# Patient Record
Sex: Male | Born: 1940 | Race: Black or African American | Hispanic: No | Marital: Married | Smoking: Current every day smoker
Health system: Southern US, Community
[De-identification: ages and names within clinical notes are randomized; demographics above are authoritative.]

## PROBLEM LIST (undated history)

## (undated) DIAGNOSIS — G629 Polyneuropathy, unspecified: Secondary | ICD-10-CM

## (undated) DIAGNOSIS — I519 Heart disease, unspecified: Secondary | ICD-10-CM

## (undated) DIAGNOSIS — F419 Anxiety disorder, unspecified: Secondary | ICD-10-CM

## (undated) DIAGNOSIS — I10 Essential (primary) hypertension: Secondary | ICD-10-CM

## (undated) DIAGNOSIS — M549 Dorsalgia, unspecified: Secondary | ICD-10-CM

## (undated) DIAGNOSIS — R12 Heartburn: Secondary | ICD-10-CM

## (undated) DIAGNOSIS — N289 Disorder of kidney and ureter, unspecified: Secondary | ICD-10-CM

## (undated) HISTORY — DX: Heart disease, unspecified: I51.9

## (undated) HISTORY — DX: Anxiety disorder, unspecified: F41.9

## (undated) HISTORY — DX: Heartburn: R12

## (undated) HISTORY — PX: HERNIA REPAIR: SHX51

## (undated) HISTORY — PX: TRANSURETHRAL RESECTION OF PROSTATE: SHX73

## (undated) HISTORY — DX: Dorsalgia, unspecified: M54.9

---

## 2002-01-08 ENCOUNTER — Emergency Department (HOSPITAL_COMMUNITY): Admission: EM | Admit: 2002-01-08 | Discharge: 2002-01-08 | Payer: Self-pay | Admitting: Emergency Medicine

## 2002-05-09 ENCOUNTER — Encounter: Payer: Self-pay | Admitting: *Deleted

## 2002-05-09 ENCOUNTER — Emergency Department (HOSPITAL_COMMUNITY): Admission: EM | Admit: 2002-05-09 | Discharge: 2002-05-10 | Payer: Self-pay | Admitting: *Deleted

## 2003-03-30 ENCOUNTER — Emergency Department (HOSPITAL_COMMUNITY): Admission: EM | Admit: 2003-03-30 | Discharge: 2003-03-30 | Payer: Self-pay | Admitting: *Deleted

## 2004-06-12 ENCOUNTER — Emergency Department (HOSPITAL_COMMUNITY): Admission: EM | Admit: 2004-06-12 | Discharge: 2004-06-12 | Payer: Self-pay | Admitting: Emergency Medicine

## 2004-08-13 ENCOUNTER — Emergency Department (HOSPITAL_COMMUNITY): Admission: EM | Admit: 2004-08-13 | Discharge: 2004-08-13 | Payer: Self-pay | Admitting: *Deleted

## 2005-04-05 ENCOUNTER — Emergency Department (HOSPITAL_COMMUNITY): Admission: EM | Admit: 2005-04-05 | Discharge: 2005-04-06 | Payer: Self-pay | Admitting: Emergency Medicine

## 2005-04-22 ENCOUNTER — Emergency Department (HOSPITAL_COMMUNITY): Admission: EM | Admit: 2005-04-22 | Discharge: 2005-04-22 | Payer: Self-pay | Admitting: Emergency Medicine

## 2005-05-03 ENCOUNTER — Emergency Department (HOSPITAL_COMMUNITY): Admission: EM | Admit: 2005-05-03 | Discharge: 2005-05-03 | Payer: Self-pay | Admitting: Emergency Medicine

## 2005-05-05 ENCOUNTER — Ambulatory Visit (HOSPITAL_COMMUNITY): Admission: RE | Admit: 2005-05-05 | Discharge: 2005-05-05 | Payer: Self-pay | Admitting: Unknown Physician Specialty

## 2006-08-05 ENCOUNTER — Emergency Department (HOSPITAL_COMMUNITY): Admission: EM | Admit: 2006-08-05 | Discharge: 2006-08-05 | Payer: Self-pay | Admitting: Emergency Medicine

## 2007-02-20 ENCOUNTER — Emergency Department (HOSPITAL_COMMUNITY): Admission: EM | Admit: 2007-02-20 | Discharge: 2007-02-20 | Payer: Self-pay | Admitting: Emergency Medicine

## 2008-01-10 ENCOUNTER — Emergency Department (HOSPITAL_COMMUNITY): Admission: EM | Admit: 2008-01-10 | Discharge: 2008-01-10 | Payer: Self-pay | Admitting: Emergency Medicine

## 2008-01-19 ENCOUNTER — Ambulatory Visit (HOSPITAL_COMMUNITY): Admission: RE | Admit: 2008-01-19 | Discharge: 2008-01-19 | Payer: Self-pay | Admitting: Orthopaedic Surgery

## 2008-07-19 ENCOUNTER — Emergency Department (HOSPITAL_COMMUNITY): Admission: EM | Admit: 2008-07-19 | Discharge: 2008-07-19 | Payer: Self-pay | Admitting: Emergency Medicine

## 2009-04-11 ENCOUNTER — Inpatient Hospital Stay (HOSPITAL_COMMUNITY): Admission: EM | Admit: 2009-04-11 | Discharge: 2009-04-12 | Payer: Self-pay | Admitting: Emergency Medicine

## 2009-04-17 DIAGNOSIS — R079 Chest pain, unspecified: Secondary | ICD-10-CM

## 2009-04-17 DIAGNOSIS — M549 Dorsalgia, unspecified: Secondary | ICD-10-CM | POA: Insufficient documentation

## 2009-04-19 ENCOUNTER — Encounter: Payer: Self-pay | Admitting: Cardiology

## 2009-04-19 ENCOUNTER — Ambulatory Visit: Payer: Self-pay | Admitting: Cardiology

## 2009-04-26 ENCOUNTER — Telehealth: Payer: Self-pay | Admitting: Cardiology

## 2010-11-11 ENCOUNTER — Encounter: Payer: Self-pay | Admitting: Cardiology

## 2010-11-25 ENCOUNTER — Emergency Department (HOSPITAL_COMMUNITY)
Admission: EM | Admit: 2010-11-25 | Discharge: 2010-11-25 | Disposition: A | Discharge status: Home / Self Care | Payer: MEDICARE | Attending: Emergency Medicine | Admitting: Emergency Medicine

## 2010-11-25 DIAGNOSIS — R339 Retention of urine, unspecified: Secondary | ICD-10-CM | POA: Insufficient documentation

## 2010-11-25 DIAGNOSIS — Z79899 Other long term (current) drug therapy: Secondary | ICD-10-CM | POA: Insufficient documentation

## 2010-11-25 DIAGNOSIS — E785 Hyperlipidemia, unspecified: Secondary | ICD-10-CM | POA: Insufficient documentation

## 2010-11-25 DIAGNOSIS — I10 Essential (primary) hypertension: Secondary | ICD-10-CM | POA: Insufficient documentation

## 2010-11-25 LAB — URINALYSIS, ROUTINE W REFLEX MICROSCOPIC
Leukocytes, UA: NEGATIVE
Nitrite: NEGATIVE
Protein, ur: NEGATIVE mg/dL
Specific Gravity, Urine: <1.005 — ABNORMAL LOW (ref 1.005–1.030)
Urobilinogen, UA: 0.2 mg/dL (ref 0.0–1.0)

## 2010-11-25 LAB — URINE MICROSCOPIC-ADD ON

## 2011-01-03 ENCOUNTER — Emergency Department (HOSPITAL_COMMUNITY)
Admission: EM | Admit: 2011-01-03 | Discharge: 2011-01-03 | Disposition: A | Discharge status: Home / Self Care | Payer: MEDICARE | Attending: Emergency Medicine | Admitting: Emergency Medicine

## 2011-01-03 DIAGNOSIS — R509 Fever, unspecified: Secondary | ICD-10-CM | POA: Insufficient documentation

## 2011-01-03 DIAGNOSIS — N390 Urinary tract infection, site not specified: Secondary | ICD-10-CM | POA: Insufficient documentation

## 2011-01-03 DIAGNOSIS — I10 Essential (primary) hypertension: Secondary | ICD-10-CM | POA: Insufficient documentation

## 2011-01-03 DIAGNOSIS — E785 Hyperlipidemia, unspecified: Secondary | ICD-10-CM | POA: Insufficient documentation

## 2011-01-03 LAB — URINALYSIS, ROUTINE W REFLEX MICROSCOPIC
Bilirubin Urine: NEGATIVE
Nitrite: POSITIVE — AB
Specific Gravity, Urine: <1.005 — ABNORMAL LOW (ref 1.005–1.030)
Urobilinogen, UA: 0.2 mg/dL (ref 0.0–1.0)
pH: 6 (ref 5.0–8.0)

## 2011-01-03 LAB — DIFFERENTIAL
Eosinophils Absolute: 0.2 10*3/uL (ref 0.0–0.7)
Lymphs Abs: 1.6 10*3/uL (ref 0.7–4.0)
Monocytes Absolute: 0.8 10*3/uL (ref 0.1–1.0)
Monocytes Relative: 8 % (ref 3–12)
Neutrophils Relative %: 75 % (ref 43–77)

## 2011-01-03 LAB — BASIC METABOLIC PANEL
BUN: 15 mg/dL (ref 6–23)
CO2: 29 mEq/L (ref 19–32)
Chloride: 102 mEq/L (ref 96–112)
Creatinine, Ser: 1.32 mg/dL (ref 0.4–1.5)

## 2011-01-03 LAB — CBC
Hemoglobin: 12.9 g/dL — ABNORMAL LOW (ref 13.0–17.0)
MCH: 30.9 pg (ref 26.0–34.0)
MCHC: 34.6 g/dL (ref 30.0–36.0)
MCV: 89.4 fL (ref 78.0–100.0)
RBC: 4.17 MIL/uL — ABNORMAL LOW (ref 4.22–5.81)

## 2011-01-03 LAB — URINE MICROSCOPIC-ADD ON

## 2011-01-06 LAB — URINE CULTURE: Colony Count: >=100000

## 2011-01-13 LAB — URINE CULTURE: Culture: NO GROWTH

## 2011-01-27 LAB — DIFFERENTIAL
Basophils Absolute: 0 10*3/uL (ref 0.0–0.1)
Eosinophils Absolute: 0.1 10*3/uL (ref 0.0–0.7)
Eosinophils Relative: 3 % (ref 0–5)
Lymphocytes Relative: 44 % (ref 12–46)
Lymphs Abs: 1.9 10*3/uL (ref 0.7–4.0)
Neutro Abs: 2.1 10*3/uL (ref 1.7–7.7)

## 2011-01-27 LAB — BASIC METABOLIC PANEL
BUN: 14 mg/dL (ref 6–23)
Calcium: 9 mg/dL (ref 8.4–10.5)
Creatinine, Ser: 1.17 mg/dL (ref 0.4–1.5)
GFR calc non Af Amer: >60 mL/min (ref >60)
Glucose, Bld: 97 mg/dL (ref 70–99)

## 2011-01-27 LAB — CBC
Platelets: 114 10*3/uL — ABNORMAL LOW (ref 150–400)
RBC: 4.25 MIL/uL (ref 4.22–5.81)
RDW: 13.1 % (ref 11.5–15.5)
WBC: 4 10*3/uL (ref 4.0–10.5)

## 2011-01-27 LAB — COMPREHENSIVE METABOLIC PANEL
ALT: 19 U/L (ref 0–53)
AST: 25 U/L (ref 0–37)
Alkaline Phosphatase: 52 U/L (ref 39–117)
CO2: 30 mEq/L (ref 19–32)
Calcium: 9.1 mg/dL (ref 8.4–10.5)
Chloride: 104 mEq/L (ref 96–112)
GFR calc Af Amer: >60 mL/min (ref >60)
GFR calc non Af Amer: >60 mL/min (ref >60)
Potassium: 3.9 mEq/L (ref 3.5–5.1)
Sodium: 139 mEq/L (ref 135–145)
Total Bilirubin: 0.4 mg/dL (ref 0.3–1.2)

## 2011-01-27 LAB — POCT CARDIAC MARKERS: Troponin i, poc: <0.05 ng/mL (ref 0.00–0.09)

## 2011-01-27 LAB — LIPID PANEL
Cholesterol: 161 mg/dL (ref 0–200)
Total CHOL/HDL Ratio: 5.2 RATIO
VLDL: 43 mg/dL — ABNORMAL HIGH (ref 0–40)

## 2011-01-27 LAB — CARDIAC PANEL(CRET KIN+CKTOT+MB+TROPI)
CK, MB: 2.1 ng/mL (ref 0.3–4.0)
CK, MB: 2.4 ng/mL (ref 0.3–4.0)
CK, MB: 2.8 ng/mL (ref 0.3–4.0)
Relative Index: 1 (ref 0.0–2.5)
Total CK: 235 U/L — ABNORMAL HIGH (ref 7–232)
Total CK: 263 U/L — ABNORMAL HIGH (ref 7–232)

## 2011-01-27 LAB — D-DIMER, QUANTITATIVE: D-Dimer, Quant: 0.35 ug/mL-FEU (ref 0.00–0.48)

## 2011-03-02 ENCOUNTER — Emergency Department (HOSPITAL_COMMUNITY)
Admission: EM | Admit: 2011-03-02 | Discharge: 2011-03-02 | Disposition: A | Discharge status: Home / Self Care | Payer: MEDICARE | Attending: Emergency Medicine | Admitting: Emergency Medicine

## 2011-03-02 DIAGNOSIS — S90569A Insect bite (nonvenomous), unspecified ankle, initial encounter: Secondary | ICD-10-CM | POA: Insufficient documentation

## 2011-03-02 DIAGNOSIS — E785 Hyperlipidemia, unspecified: Secondary | ICD-10-CM | POA: Insufficient documentation

## 2011-03-02 DIAGNOSIS — W57XXXA Bitten or stung by nonvenomous insect and other nonvenomous arthropods, initial encounter: Secondary | ICD-10-CM | POA: Insufficient documentation

## 2011-03-02 DIAGNOSIS — F17200 Nicotine dependence, unspecified, uncomplicated: Secondary | ICD-10-CM | POA: Insufficient documentation

## 2011-03-02 DIAGNOSIS — I10 Essential (primary) hypertension: Secondary | ICD-10-CM | POA: Insufficient documentation

## 2011-03-04 NOTE — Assessment & Plan Note (Signed)
[facility]                       [place] [facility] OFFICE NOTE   NAME:VOSSSkye, Rodarte                       MRN:          [id number]  DATE:04/19/2009                            DOB:          12/19/1941    I was asked by Dr. Derenda Mis to consult on Dennis Ruiz for chest  discomfort and cardiac risk factors.   Dennis Ruiz is a 70 year old African American Ruiz, who was admitted on  April 11, 2009 to [facility] with chest discomfort.  Described  as ache in his left shoulder coming around to his chest.  He could  belch, but could not quite get rid of it.   He has had a history of chest discomfort in the past.  Apparently, he  had a stress test years ago in [place].   His discomfort was relieved with a GI cocktail and a proton pump  inhibitor, which he was discharged on.  His enzymes were negative.   EKG showed no acute changes.  Chest x-ray demonstrated minimal scarring  of the lung bases, right greater than left, and calcified pleural  plaques on the left.  He has a history of granulomatous disease.  A  chest CT was also obtained, which showed granulomatous disease.   He does smoke about 2 cigarettes a day.  He used to be a heavy smoker.   Apparently, he has had a history of hypertension in the past, but his  medicines were stopped in [place] because his pressure got too low.  In  addition, he has a history of hyperlipidemia and has been on Lipitor in  the past, but he is no longer taking.  He does take an aspirin 325 mg a  day.   PAST MEDICAL HISTORY:  In addition to above, he has chronic neck issues  and is disabled from an injury at work in [employer].   He smokes 2 cigarettes a day.  He quit drinking about a year ago.   SURGICAL HISTORY:  Hernia repaired about 20 years ago in [place].   SOCIAL HISTORY:  Disabled.  He is widowed.  He has 3 children, 2 boys  and a girl.   FAMILY HISTORY:  Really unremarkable for  any premature coronary artery  disease.   REVIEW OF SYSTEMS:  He wears reading glasses.  His teeth are in good  shape with no dentures.  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  Otherwise, his review of systems are negative.   CURRENT MEDICATIONS:  1. Aspirin 325 mg per day.  2. Omeprazole 20 mg a day.  3. Restoril 30 mg nightly.  4. Fish oil 1000 mg daily.  5. Multivitamin daily.  6. Cialis 5 mg p.r.n.  7. He takes Vicodin 5/500 p.r.n.  8. OxyContin 10 mg a day and p.r.n.   PHYSICAL EXAMINATION:  VITAL SIGNS:  He is 6 feet 2 and weighs 221.  GENERAL:  He is a very pleasant gentleman in no acute distress.  HEENT:  Bearded, graying, teeth in good repair, otherwise negative.  NECK:  Supple.  Carotid upstrokes were equal bilaterally without obvious  bruit.  Thyroid is not enlarged.  No lymphadenopathy.  LUNGS:  Inspiratory and expiratory rhonchi.  No rubs.  HEART:  A nondisplaced PMI, normal S1 and S2.  No murmur, rub, or  gallop.  ABDOMEN:  Soft, good bowel sounds.  No midline bruit.  No hepatomegaly.  EXTREMITIES:  No cyanosis, clubbing, or edema.  Pulses are present.  There is no sign of DVT.  SKIN:  Unremarkable.  MUSCULOSKELETAL:  Chronic arthritic changes.   His electrocardiogram shows sinus rhythm with no ST-segment changes.   ASSESSMENT:  1. Chest discomfort in a man with multiple cardiac risk factors.  2. Hyperlipidemia.  No longer being treated.  3. Hypertension, resolved without meds.  4. Tobacco use.   PLAN:  1. Adenosine rest stress Myoview or Lexiscan.  2. Continue aspirin.  3. Fasting lipids and a CMET.  He will probably need a statin drug      such as Lipitor that he has taken in the past.   The patient is counseled not to smoke.     Thomas C. Daleen Squibb, MD, [facility]  Electronically Signed    TCW/MedQ  DD: 04/19/2009  DT: 04/20/2009  Job #: [id number]   cc:   Melissa L. Ladona Ridgel, MD

## 2011-03-04 NOTE — H&P (Signed)
Dennis Ruiz, Dennis Ruiz                ACCOUNT NO.:  [id number]   MEDICAL RECORD NO.:  [id number]          PATIENT TYPE:  INP   LOCATION:  A328                          FACILITY:  [facility]   PHYSICIAN:  Osvaldo Shipper, MD     DATE OF BIRTH:  12/19/1941   DATE OF ADMISSION:  04/11/2009  DATE OF DISCHARGE:  LH                              HISTORY & PHYSICAL   The patient does not have a PMD.  He has been seen by Dr. Hilda Lias in the  past for back, neck pain.   ADMISSION DIAGNOSES:  1. Left-sided chest pain.  2. History of chronic back pain and neck pain.   CHIEF COMPLAINT:  Left-sided chest pain for the past 3 days.   HISTORY OF PRESENT ILLNESS:  The patient is a 70 year old African  American male who has back pain and neck pain, however has not seen a  primary medical doctor in many years.  He was doing well until Saturday  morning when he drove to the mountains, which was about a five hour  drive and while he was up there he started feeling tightness in the left  side of his chest.  These symptoms progressively got worse over the  following 2-3 days.  He drove back to [place].  He did not seek any  attention for his chest pain until today.  The pain is described as a  tightness.  It was 10/10 in intensity.  Currently he said it is 8/10 in  intensity, though he is sitting comfortably.  He states that the pain  fell like a gas.  He also admits to some shortness of breath 2 days ago  but not currently.  No nausea, vomiting, no palpitations.  No  diaphoresis, though he did feel hot.  Denies any lightheadedness.  He  denies any leg swelling.  He states he had similar pain 6 month ago but  did not see a doctor.  It feels like heartburn.  He feels like he wants  to burp but he cannot.  There is no radiation of the pain.  No  aggravating, relieving or precipitating factors identified.  Denies any  weight loss.   MEDICATIONS AT HOME:  1. Restoril for insomnia which he has not been taking for  2-3 days.  2. Vicodin for back pain, has not been taking for past few days.  3. OxyContin 10 mg once a day, not been taking for 3-4 days.  4. He took Cialis for erectile dysfunction about a week to 2 weeks ago      and none since them.  5. He also takes vitamin E, fish oil and vitamin C.  6. No aspirin use.   ALLERGIES:  No known drug allergies.   PAST MEDICAL HISTORY:  Denies any heart disease, lung disease, stroke.  He has back pain, neck pain, insomnia.  Otherwise he denies any other  complaints.  No other medical problems.  He had a stress test 10 years  ago which was thought to be normal.  He has never had a cardiac cath.  He has  had a hernia repair in the past.   SOCIAL HISTORY:  He drives a lot.  His wife passed earlier this year.  He currently is on disability.  He smokes two cigarettes per day.  No  alcohol use.  Denies any illicit drug use.   FAMILY HISTORY:  Father died of unknown cancer, some heart disease in  his father at the age of 50 and also some unknown cancer in siblings,  but he does not seem to know much about them.   REVIEW OF SYSTEMS:  GENERAL:  Positive for weakness, malaise.  HEENT:  Unremarkable.  CARDIOVASCULAR:  As in  HPI.  RESPIRATORY:  Positive for  cough with whitish expectoration.  Denies any hemoptysis.  Denies any  fever or chills.  GI: Unremarkable.  GU:  Unremarkable.  NEUROLOGIC:  Unremarkable.  PSYCHIATRIC:  Unremarkable.  MUSCULOSKELETAL:  Positive  for back pain, otherwise unremarkable.  Other systems are all negative.   PHYSICAL EXAMINATION:  When he presented to the ED his temperature was  98.0, blood pressure was 134/73, heart rate 65, respiratory rate 20,  saturation 99% on room air.  Currently heart rate in the 50s.  GENERAL EXAM:  Well-developed, well-nourished black male in no distress.  HEENT: There is no pallor, no icterus.  Head is normocephalic,  atraumatic.  Moist mucous membranes are present.  NECK:  Soft and supple.  No  thyromegaly is appreciated.  No  lymphadenopathy is noted in the cervical and supraclavicular regions.  LUNGS:  Clear to auscultation bilaterally.  No wheezing, rales or  rhonchi.  No dullness to percussion.  CARDIOVASCULAR:  S1. S2 are normal, regular.  No murmurs appreciated.  No S3, S4, no rubs.  No bruits.  No JVD.  No pedal edema.  Peripheral  pulses are palpable.  ABDOMEN:  Soft, nontender, nondistended.  Bowel sounds are present.  No  mass or organomegaly is appreciated.  Chest does not reveal any  tenderness to palpation.  GU:  Examination deferred.  MUSCULOSKELETAL:  Unremarkable.  NEUROLOGIC:  He is alert, oriented x3.  No focal neurological deficits  are present.  SKIN:  Shows no rashes.   CBC shows platelet count 125, slightly on the lower side.  He also has  slightly low neutrophil count but the total white count is normal.  Actually otherwise other differential is okay.  D-dimer was normal.  Electrolytes and liver function tests are unremarkable.  Cardiac enzymes  negative x1.  This was at 12:30 p.m. today.  Chest x-ray:  Read as  showing normal heart size.  Several calcified granulomas in the left  lung which have been previously present, may be an element of COPD  identified.   EKG shows a sinus rhythm with a normal axis.  Intervals appear to be in  the normal range.  There might be a Q-wave in V1 and V2, T inversion V1,  V2, otherwise no other changes appreciated.  No other EKGs are available  at this time.   ASSESSMENT:  This is a 70 year old African American male who presents  with 3-4 day history of chest pain, left-sided, described as a  tightness.  With a negative D-dimer thromboembolic event is less likely.  The differentials include coronary artery disease.  Gastrointestinal  etiology is also possible.  I do mot know if the nonspecific granulomas  seen on chest x-ray are playing a role as they happen to be on the same  side as his chest pain.  He is a  smoker.   PLAN:  1. Chest pain.  He is currently on telemetry.  He will be ruled out      for acute coronary syndrome with serial cardiac enzymes.      Electrocardiograms will be repeated.  Lipid panel will be checked.      I think we need to further evaluate these granulomas seen on chest      x-ray.  He has never had a CT so I am going to go ahead and order      that. Because he is mentioning belching-like symptoms, I will give      him gastrointestinal cocktail and Protonix.  We can also try a dose      of nitroglycerin to see if that changes his chest pain. His Cialis      use was more than a week ago, so it should not interfere with      nitroglycerin use at this time.  2. Mild thrombocytopenia.  We will closely monitor this.  His liver      function tests are normal.  I will go ahead and order an HIV test.  3. Deep vein thrombosis prophylaxis for now with Lovenox and if there      is evidence that his platelet counts are dropping this will need to      be discontinued and alternative methods will have to be utilized.   Further management decisions will depend on results of further testing  and patient's response to treatment.      Osvaldo Shipper, MD  Electronically Signed     GK/MEDQ  D:  04/11/2009  T:  04/11/2009  Job:  [id number]

## 2011-03-04 NOTE — Discharge Summary (Signed)
NAME:  Dennis Ruiz, Dennis Ruiz                ACCOUNT NO.:  [id number]   MEDICAL RECORD NO.:  [id number]          PATIENT TYPE:  INP   LOCATION:  A328                          FACILITY:  [facility]   PHYSICIAN:  Melissa L. Ladona Ridgel, MD  DATE OF BIRTH:  12/19/1941   DATE OF ADMISSION:  04/11/2009  DATE OF DISCHARGE:  06/24/2010LH                               DISCHARGE SUMMARY   DISCHARGING DIAGNOSES:  1. Atypical chest pain which does not appear to be cardiac, at least      based on cardiac markers and EKG findings.  The patient was ruled      out with serial cardiac markers and actually responded the most      favorably to a GI cocktail and proton pump inhibitor with the      complete resolution of his pain.  At this time the patient and I      are in agreement that outpatient testing for his heart would be in      order just to make sure that it is not cardiac in origin.  He has      been instructed that since he travels a bit that if he develops      further chest pain, then he should report to the nearest emergency      room, but at this time I feel comfortable that outpatient stress      testing would be appropriate.  I will discharge the patient on a      proton pump inhibitor and an aspirin and have him follow up with      [facility] next week.  They have been kind enough to give      [place] an appointment July 1 at 1 p.m. with Dr. Daleen Squibb.  2. Granulomatous disease on a chest x-ray.  a CT of the chest was      obtained, which showed no obvious pneumonia, blood clots or any      extensive granulomatous disease that needs to be investigated.  the      reported finding shows a linear scarring or atelectasis at the      right lower lobe and there are scattered calcified pleural plaques      on the left, which likely correspond to the findings on the x-ray.      the x-ray itself reported the calcified granuloma in the left mid      lung.  3. Tobacco abuse.  The patient has been counseled to  discontinue      smoking as it is unhealthy for his lungs and his heart.  4. Chronic neck pain.  The patient sees a physician in [place], who      is his primary physician for his pain medications.  He is scheduled      to see him sometime soon, and I will not be giving him any      narcotics for discharge.   MEDICATIONS AT THE TIME OF DISCHARGE:  1. Aspirin 325 mg.  2. Omeprazole 20 mg a day.  3. The patient can continue the Restoril,  which he believes is 30 mg      at bedtime.  4. He can continue his home Vicodin dose as needed, and this can be      obtained from his physician in [place].  5. He can continue his OxyContin 10 mg once daily.  Again, this will      be provided by his physician in [place].  6. The patient can continue his fish oil and vitamins.   He has been educated on the use of Cialis in conjunction with nitrates.   HOSPITAL COURSE:  The patient is a very pleasant 70 year old [place]  American gentleman, who states that he has traveled out to [place], [place], to visit with his son.  He took kind of a circuitous route  through the mountains up into [place] and did a lot of driving.  Based  on the patient's serious nature of his neck and back, this definitely  was exacerbating some of his symptoms and may actually account for the  pain that he was having in his chest.  The patient described no  associated symptoms of shortness of breath, diaphoresis,  lightheadedness, nausea, vomiting or sweating, and therefore I feel that  this pain is generally atypical for a true angina.  On the day of  discharge, the patient is clinically well.  He is chest pain-free.   Temperature is 97.3, blood pressures have ranged from 95-122 systolic  and diastolic 62-72, heart rate 51, respirations 20, saturation 100%.  GENERAL:  This is a moderately obese African American male in no acute  distress.  He is normocephalic, atraumatic.  Pupils equal, round and reactive to   light.  Extraocular muscles intact.  Mucous membranes are moist.  NECK:  Supple.  There is no JVD, no lymph nodes, no carotid bruits.  CHEST:  Decreased but clear to auscultation.  There are no rhonchi,  rales or wheezes.  CARDIOVASCULAR:  Regular rate rhythm, positive S1, S2.  No S3, S4.  No  murmurs, rubs or gallops.  ABDOMEN:  Soft, nontender, nondistended, with positive bowel sounds.  EXTREMITIES:  Show no clubbing, cyanosis or edema.  NEUROLOGIC:  He is awake, alert, oriented.   PERTINENT LABORATORY VALUES DURING THE COURSE OF THIS HOSPITAL STAY:  Point of care markers were negative.  Three sets of cardiac markers  showed only a slight elevation in his creatine  kinase but no elevation  in his MB or troponin.  His white count was 4.9 with a hemoglobin of 14,  hematocrit 39.8 and platelets of 114.  His BMET:  Sodium is 140,  potassium 4.4, chloride 103, CO2 31, glucose was 97, BUN 14, and his  creatinine is 1.7.   An HIV test was scheduled to be sent; however, they have not received  the specimen yet and therefore I will need to follow up on this for the  patient while he discharged from the hospital, so I will do so and  contact the patient if there is any abnormality there.   So at this time the patient is deemed stable for discharge to home with  followup as an outpatient to [facility] for a stress test, which  has been scheduled for July 1 at 1 p.m.   CONDITION AT DISCHARGE:  Stable.  The patient has been instructed if the  chest pain returns that he should go to the nearest emergency room for  further evaluation.  The patient also has been instructed that perhaps  driving long distances without a break is not an appropriate way for him  to travel with the extensive nature of his neck pain and debility.  He  is unable to really turn his neck extensively from one side to another  because of his previous neck problems, and therefore, I am recommending  that if he does  drive that he should take frequent breaks and rest his  shoulders and his neck.   This is less than a 30-minutes discharge.      Melissa L. Ladona Ridgel, MD  Electronically Signed     MLT/MEDQ  D:  04/12/2009  T:  04/12/2009  Job:  [id number]

## 2011-07-21 LAB — DIFFERENTIAL
Eosinophils Relative: 4
Lymphocytes Relative: 44
Lymphs Abs: 1.8
Monocytes Absolute: 0.6
Monocytes Relative: 15 — ABNORMAL HIGH
Neutro Abs: 1.5 — ABNORMAL LOW

## 2011-07-21 LAB — CBC
HCT: 40.2
Hemoglobin: 13.9
RBC: 4.24

## 2011-07-21 LAB — POCT CARDIAC MARKERS
CKMB, poc: 1.6
Myoglobin, poc: 81.7
Troponin i, poc: <0.05
Troponin i, poc: <0.05

## 2011-07-21 LAB — BASIC METABOLIC PANEL
Calcium: 9.2
GFR calc Af Amer: >60
GFR calc non Af Amer: 53 — ABNORMAL LOW
Glucose, Bld: 103 — ABNORMAL HIGH
Potassium: 3.8
Sodium: 140

## 2011-10-20 ENCOUNTER — Encounter: Payer: Self-pay | Admitting: Cardiology

## 2011-11-26 ENCOUNTER — Encounter (HOSPITAL_COMMUNITY): Payer: Self-pay | Admitting: *Deleted

## 2011-11-26 ENCOUNTER — Emergency Department (HOSPITAL_COMMUNITY)
Admission: EM | Admit: 2011-11-26 | Discharge: 2011-11-26 | Disposition: A | Discharge status: Home / Self Care | Payer: Medicare Other | Attending: Emergency Medicine | Admitting: Emergency Medicine

## 2011-11-26 ENCOUNTER — Emergency Department (HOSPITAL_COMMUNITY): Payer: Medicare Other

## 2011-11-26 DIAGNOSIS — R079 Chest pain, unspecified: Secondary | ICD-10-CM | POA: Insufficient documentation

## 2011-11-26 DIAGNOSIS — S20219A Contusion of unspecified front wall of thorax, initial encounter: Secondary | ICD-10-CM | POA: Insufficient documentation

## 2011-11-26 DIAGNOSIS — Z79899 Other long term (current) drug therapy: Secondary | ICD-10-CM | POA: Insufficient documentation

## 2011-11-26 DIAGNOSIS — X500XXA Overexertion from strenuous movement or load, initial encounter: Secondary | ICD-10-CM | POA: Insufficient documentation

## 2011-11-26 DIAGNOSIS — Z7982 Long term (current) use of aspirin: Secondary | ICD-10-CM | POA: Insufficient documentation

## 2011-11-26 MED ORDER — HYDROCODONE-ACETAMINOPHEN 5-325 MG PO TABS
1.0000 | ORAL_TABLET | Freq: Once | ORAL | Status: AC
  Administered 2011-11-26: 1 via ORAL
  Filled 2011-11-26: qty 1

## 2011-11-26 MED ORDER — HYDROCODONE-ACETAMINOPHEN 5-325 MG PO TABS: 1.0000 | ORAL_TABLET | Freq: Four times a day (QID) | ORAL | Status: AC | PRN

## 2011-11-26 NOTE — ED Provider Notes (Addendum)
History     CSN: [id number]  Arrival date & time 11/26/11  5784   First MD Initiated Contact with Patient 11/26/11 2125      Chief Complaint  Patient presents with  . 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  He is c/o R lower anterior rib pain  The history is provided by the patient. No language interpreter was used.    Past Medical History  Diagnosis Date  . Back pain   . Chest pain     Past Surgical History  Procedure Date  . Hernia repair     Family History  Problem Relation Age of Onset  . Cancer Father     History  Substance Use Topics  . Smoking status: Smoker, Current Status Unknown  . Smokeless tobacco: Not on file  . Alcohol Use: No      Review of Systems  Respiratory: Negative for cough, shortness of breath, wheezing and stridor.        Rib pain  All other systems reviewed and are negative.    Allergies  Review of patient's allergies indicates no known allergies.  Home Medications   Current Outpatient Rx  Name Route Sig Dispense Refill  . ASPIRIN EC 81 MG PO TBEC Oral Take 81 mg by mouth every morning.    Marland Kitchen OMEGA-3 FATTY ACIDS 1000 MG PO CAPS Oral Take 1 capsule by mouth daily.      Marland Kitchen HYDROCODONE-ACETAMINOPHEN 5-500 MG PO TABS Oral Take 0.5-1 tablets by mouth daily as needed. For pain    . ADULT MULTIVITAMIN W/MINERALS CH Oral Take 1 tablet by mouth every morning.    Marland Kitchen TADALAFIL 5 MG PO TABS Oral Take 5 mg by mouth daily as needed. For intercourse      BP 133/78  Pulse 85  Temp(Src) 98.5 F (36.9 C) (Oral)  Resp 18  Ht 6\' 2"  (1.88 m)  Wt 200 lb (90.719 kg)  BMI 25.68 kg/m2  SpO2 99%  Physical Exam  Nursing note and vitals reviewed. Constitutional: He is oriented to person, place, and time. He appears well-developed and well-nourished.  HENT:  Head: Normocephalic and atraumatic.  Eyes: EOM are  normal.  Neck: Normal range of motion.  Cardiovascular: Normal rate, regular rhythm, normal heart sounds and intact distal pulses.   Pulmonary/Chest: Effort normal and breath sounds normal. No accessory muscle usage. Not tachypneic. No respiratory distress. He has no decreased breath sounds. He has no wheezes. He has no rhonchi. He has no rales. He exhibits tenderness.  Abdominal: Soft. He exhibits no distension. There is no tenderness.  Musculoskeletal: Normal range of motion.  Neurological: He is alert and oriented to person, place, and time.  Skin: Skin is warm and dry.  Psychiatric: He has a normal mood and affect. Judgment normal.    ED Course  Procedures (including critical care time)  Labs Reviewed - No data to display Dg Ribs Unilateral W/chest Right  11/26/2011  *RADIOLOGY REPORT*  Clinical Data: Right rib pain.  RIGHT RIBS AND CHEST - 3+ VIEW  Comparison: Chest x-ray dated 04/11/2009  Findings: No evidence of right rib fracture, pleural fluid or pneumothorax.  Visualized lung fields are clear.  The heart size is within normal limits.  No bony lesions.  IMPRESSION: Normal right ribs.  Original Report Authenticated By: Reola Calkins, M.D.     No diagnosis found.    MDM  Worthy Rancher, PA 11/26/11 2135  Worthy Rancher, PA 11/28/11 [phone]

## 2011-11-26 NOTE — ED Notes (Signed)
Pt states was working on truck, "wresling a part as I was leaning against the door rather hard and felt something pop". Pt denies SOB or any difficulty breathing. Pain increases with movement of arm.

## 2011-11-26 NOTE — ED Notes (Signed)
Pt reports he was working on his truck yesterday and reached over and heard a "pop" on rt side, now c/o pain to rt rib area, nad

## 2011-11-26 NOTE — ED Notes (Signed)
Pt requesting copy of xray's to take to PCP's office visit. Radiology notified of request.

## 2011-11-27 NOTE — ED Provider Notes (Signed)
Medical screening examination/treatment/procedure(s) were performed by non-physician practitioner and as supervising physician I was immediately available for consultation/collaboration.  Marinus Eicher S. Jerris Keltz, MD 11/27/11 1513 

## 2011-11-28 NOTE — ED Provider Notes (Signed)
Medical screening examination/treatment/procedure(s) were performed by non-physician practitioner and as supervising physician I was immediately available for consultation/collaboration. Devoria Albe, MD, FACEP   Ward Givens, MD 11/28/11 [phone]

## 2012-01-08 ENCOUNTER — Encounter (HOSPITAL_COMMUNITY): Payer: Self-pay | Admitting: *Deleted

## 2012-01-08 ENCOUNTER — Emergency Department (HOSPITAL_COMMUNITY)
Admission: EM | Admit: 2012-01-08 | Discharge: 2012-01-08 | Disposition: A | Discharge status: Home / Self Care | Payer: Medicare Other | Attending: Emergency Medicine | Admitting: Emergency Medicine

## 2012-01-08 ENCOUNTER — Emergency Department (HOSPITAL_COMMUNITY): Payer: Medicare Other

## 2012-01-08 DIAGNOSIS — F17200 Nicotine dependence, unspecified, uncomplicated: Secondary | ICD-10-CM | POA: Insufficient documentation

## 2012-01-08 DIAGNOSIS — Z79899 Other long term (current) drug therapy: Secondary | ICD-10-CM | POA: Insufficient documentation

## 2012-01-08 DIAGNOSIS — R0989 Other specified symptoms and signs involving the circulatory and respiratory systems: Secondary | ICD-10-CM | POA: Insufficient documentation

## 2012-01-08 DIAGNOSIS — Z7982 Long term (current) use of aspirin: Secondary | ICD-10-CM | POA: Insufficient documentation

## 2012-01-08 DIAGNOSIS — R05 Cough: Secondary | ICD-10-CM

## 2012-01-08 DIAGNOSIS — J40 Bronchitis, not specified as acute or chronic: Secondary | ICD-10-CM | POA: Insufficient documentation

## 2012-01-08 DIAGNOSIS — R059 Cough, unspecified: Secondary | ICD-10-CM | POA: Insufficient documentation

## 2012-01-08 MED ORDER — GUAIFENESIN-CODEINE 100-10 MG/5ML PO SYRP: 5.0000 mL | ORAL_SOLUTION | Freq: Three times a day (TID) | ORAL | Status: AC | PRN

## 2012-01-08 MED ORDER — PREDNISONE 20 MG PO TABS: 40.0000 mg | ORAL_TABLET | Freq: Every day | ORAL | Status: AC

## 2012-01-08 MED ORDER — GUAIFENESIN-CODEINE 100-10 MG/5ML PO SOLN
5.0000 mL | Freq: Once | ORAL | Status: AC
  Administered 2012-01-08: 5 mL via ORAL
  Filled 2012-01-08: qty 5

## 2012-01-08 NOTE — Discharge Instructions (Signed)
Antibiotic Nonuse  Your caregiver felt that the infection or problem was not one that would be helped with an antibiotic. Infections may be caused by viruses or bacteria. Only a caregiver can tell which one of these is the likely cause of an illness. A cold is the most common cause of infection in both adults and children. A cold is a virus. Antibiotic treatment will have no effect on a viral infection. Viruses can lead to many lost days of work caring for sick children and many missed days of school. Children may catch as many as 10 "colds" or "flus" per year during which they can be tearful, cranky, and uncomfortable. The goal of treating a virus is aimed at keeping the ill person comfortable. Antibiotics are medications used to help the body fight bacterial infections. There are relatively few types of bacteria that cause infections but there are hundreds of viruses. While both viruses and bacteria cause infection they are very different types of germs. A viral infection will typically go away by itself within 7 to 10 days. Bacterial infections may spread or get worse without antibiotic treatment. Examples of bacterial infections are:  Sore throats (like strep throat or tonsillitis).   Infection in the lung (pneumonia).   Ear and skin infections.  Examples of viral infections are:  Colds or flus.   Most coughs and bronchitis.   Sore throats not caused by Strep.   Runny noses.  It is often best not to take an antibiotic when a viral infection is the cause of the problem. Antibiotics can kill off the helpful bacteria that we have inside our body and allow harmful bacteria to start growing. Antibiotics can cause side effects such as allergies, nausea, and diarrhea without helping to improve the symptoms of the viral infection. Additionally, repeated uses of antibiotics can cause bacteria inside of our body to become resistant. That resistance can be passed onto harmful bacterial. The next time  you have an infection it may be harder to treat if antibiotics are used when they are not needed. Not treating with antibiotics allows our own immune system to develop and take care of infections more efficiently. Also, antibiotics will work better for us when they are prescribed for bacterial infections. Treatments for a child that is ill may include:  Give extra fluids throughout the day to stay hydrated.   Get plenty of rest.   Only give your child over-the-counter or prescription medicines for pain, discomfort, or fever as directed by your caregiver.   The use of a cool mist humidifier may help stuffy noses.   Cold medications if suggested by your caregiver.  Your caregiver may decide to start you on an antibiotic if:  The problem you were seen for today continues for a longer length of time than expected.   You develop a secondary bacterial infection.  SEEK MEDICAL CARE IF:  Fever lasts longer than 5 days.   Symptoms continue to get worse after 5 to 7 days or become severe.   Difficulty in breathing develops.   Signs of dehydration develop (poor drinking, rare urinating, dark colored urine).   Changes in behavior or worsening tiredness (listlessness or lethargy).  Document Released: 12/15/2001 Document Revised: 09/25/2011 Document Reviewed: 06/13/2009 [employer] Patient Information 2012 [employer].Bronchitis Bronchitis is a problem of the air tubes leading to your lungs. This problem makes it hard for air to get in and out of the lungs. You may cough a lot because your air tubes are   narrow. Going without care can cause lasting (chronic) bronchitis. HOME CARE   Drink enough fluids to keep your pee (urine) clear or pale yellow.   Use a cool mist humidifier.   Quit smoking if you smoke. If you keep smoking, the bronchitis might not get better.   Only take medicine as told by your doctor.  GET HELP RIGHT AWAY IF:   Coughing keeps you awake.   You start to wheeze.    You become more sick or weak.   You have a hard time breathing or get short of breath.   You cough up blood.   Coughing lasts more than 2 weeks.   You have a fever.   Your baby is older than 3 months with a rectal temperature of 102 F (38.9 C) or higher.   Your baby is 81 months old or younger with a rectal temperature of 100.4 F (38 C) or higher.  MAKE SURE YOU:  Understand these instructions.   Will watch your condition.   Will get help right away if you are not doing well or get worse.  Document Released: 03/24/2008 Document Revised: 09/25/2011 Document Reviewed: 09/07/2009 [facility] Patient Information 2012 [place], [place].Cough, Adult  A cough is a reflex that helps clear your throat and airways. It can help heal the body or may be a reaction to an irritated airway. A cough may only last 2 or 3 weeks (acute) or may last more than 8 weeks (chronic).  CAUSES Acute cough:  Viral or bacterial infections.  Chronic cough:  Infections.   Allergies.   Asthma.   Post-nasal drip.   Smoking.   Heartburn or acid reflux.   Some medicines.   Chronic lung problems (COPD).   Cancer.  SYMPTOMS   Cough.   Fever.   Chest pain.   Increased breathing rate.   High-pitched whistling sound when breathing (wheezing).   Colored mucus that you cough up (sputum).  TREATMENT   A bacterial cough may be treated with antibiotic medicine.   A viral cough must run its course and will not respond to antibiotics.   Your caregiver may recommend other treatments if you have a chronic cough.  HOME CARE INSTRUCTIONS   Only take over-the-counter or prescription medicines for pain, discomfort, or fever as directed by your caregiver. Use cough suppressants only as directed by your caregiver.   Use a cold steam vaporizer or humidifier in your bedroom or home to help loosen secretions.   Sleep in a semi-upright position if your cough is worse at night.   Rest as needed.    Stop smoking if you smoke.  SEEK IMMEDIATE MEDICAL CARE IF:   You have pus in your sputum.   Your cough starts to worsen.   You cannot control your cough with suppressants and are losing sleep.   You begin coughing up blood.   You have difficulty breathing.   You develop pain which is getting worse or is uncontrolled with medicine.   You have a fever.  MAKE SURE YOU:   Understand these instructions.   Will watch your condition.   Will get help right away if you are not doing well or get worse.  Document Released: 04/04/2011 Document Revised: 09/25/2011 Document Reviewed: 04/04/2011 [facility] Patient Information 2012 [place], [place].  RESOURCE GUIDE  Dental Problems  Patients with Medicaid: [facility]  [address]                                           [phone] Phone:  [phone]                                                  Phone:  [phone]  If unable to pay or uninsured, contact:  [facility] or [facility]. to become qualified for the adult dental clinic.  Chronic Pain Problems Contact [facility]  [phone] Patients need to be referred by their primary care doctor.  Insufficient Money for Medicine Contact [employer]:  call "211" or [facility] [phone].  No Primary Care Doctor Call [facility]  [phone] Other agencies that provide inexpensive medical care    [facility]  [phone]    [facility]  [phone]    [facility]  [phone]    [facility]  [phone]    [facility]  [phone]    [facility]  [phone]  Psychological [facility]  [phone] [facility]  [phone] [facility]   6121098281 (emergency services [phone])  Substance Abuse Resources [facility]  6155860914 [facility]  [phone] [address] [phone] Floydene Flock [phone] Residential & Outpatient Substance Abuse Program  6124739807  Abuse/Neglect [facility] Child Abuse Hotline [phone] [phone] [facility] Child Abuse Hotline 5143413929 (After Hours)  Emergency Shelter [facility] [phone]  [facility] [place] [facility] [place] [phone] [facility] [phone]  MRSA Hotline #:   (980) 581-3495    [facility] Resources  [facility] [place]     [employer]                          [facility] Dept. [address] [place]                       [address]      [address]  Blondell Reveal Phone:  [phone]                                   Phone:  [phone]                 Phone:  [phone]  [facility] Phone:  [phone]  [facility] Child Abuse Hotline [phone] (After Hours)

## 2012-01-08 NOTE — ED Provider Notes (Signed)
History    71 year old male with cough. Gradual onset about a week and a half ago. Feels congested. Is productive. No shortness of breath. No fevers or chills. Denies any chest pain. Patient is a smoker. Denies history of diagnosed underlying lung disease. No unusual leg pain or swelling. Denies history of blood clot. Has been taking  "you know, a little of this and a little of that" for his cough without much relief.    CSN: [id number]  Arrival date & time 01/08/12  1556   First MD Initiated Contact with Patient 01/08/12 1618      Chief Complaint  Patient presents with  . Cough    (Consider location/radiation/quality/duration/timing/severity/associated sxs/prior treatment) HPI  Past Medical History  Diagnosis Date  . Back pain   . Chest pain     Past Surgical History  Procedure Date  . Hernia repair   . Transurethral resection of prostate     Family History  Problem Relation Age of Onset  . Cancer Father     History  Substance Use Topics  . Smoking status: Current Everyday Smoker  . Smokeless tobacco: Not on file  . Alcohol Use: No      Review of Systems   Review of symptoms negative unless otherwise noted in HPI.   Allergies  Review of patient's allergies indicates no known allergies.  Home Medications   Current Outpatient Rx  Name Route Sig Dispense Refill  . ASPIRIN EC 81 MG PO TBEC Oral Take 81 mg by mouth every morning.    Marland Kitchen BISACODYL 5 MG PO TBEC Oral Take 5 mg by mouth as needed. FOR RELIEF    . VITAMIN D 1000 UNITS PO TABS Oral Take 1,000 Units by mouth every morning.    Marland Kitchen OMEGA-3 FATTY ACIDS 1000 MG PO CAPS Oral Take 1 capsule by mouth daily.      Marland Kitchen HYDROCODONE-ACETAMINOPHEN 5-500 MG PO TABS Oral Take 0.5-1 tablets by mouth daily as needed. For pain    . GERITOL TONIC PO Oral Take 5 mLs by mouth every morning.    Marland Kitchen TADALAFIL 5 MG PO TABS Oral Take 5 mg by mouth daily as needed. For intercourse    . VITAMIN E PO Oral Take 1 tablet by mouth every  morning.      BP 139/90  Pulse 79  Temp(Src) 97.6 F (36.4 C) (Oral)  Resp 16  Ht 6\' 2"  (1.88 m)  Wt 210 lb (95.255 kg)  BMI 26.96 kg/m2  SpO2 100%  Physical Exam  Nursing note and vitals reviewed. Constitutional: He appears well-developed and well-nourished. No distress.  HENT:  Head: Normocephalic and atraumatic.  Right Ear: External ear normal.  Left Ear: External ear normal.  Mouth/Throat: Oropharynx is clear and moist. No oropharyngeal exudate.  Eyes: Conjunctivae are normal. Pupils are equal, round, and reactive to light. Right eye exhibits no discharge. Left eye exhibits no discharge.  Neck: Normal range of motion. Neck supple.  Cardiovascular: Normal rate, regular rhythm and normal heart sounds.  Exam reveals no gallop and no friction rub.   No murmur heard. Pulmonary/Chest: Effort normal and breath sounds normal. No respiratory distress. He has no wheezes. He has no rales.  Abdominal: Soft. He exhibits no distension. There is no tenderness.  Musculoskeletal: He exhibits no edema and no tenderness.  Lymphadenopathy:    He has no cervical adenopathy.  Neurological: He is alert.  Skin: Skin is warm and dry.  Psychiatric: He has a normal mood and affect.  His behavior is normal. Thought content normal.    ED Course  Procedures (including critical care time)  Labs Reviewed - No data to display Dg Chest 2 View  01/08/2012  *RADIOLOGY REPORT*  Clinical Data: Cough, congestion for a week  CHEST - 2 VIEW  Comparison: Chest x-ray of 11/26/2011 and CT chest of 04/12/2009  Findings: Nodular opacities anteriorly in the retrosternal air space are consistent with calcified granulomas anteriorly in the lingula when compared to the prior CT.  The lungs are otherwise clear and slightly hyperaerated.  Mediastinal contours appear normal.  The heart is within normal limits in size.  No acute bony abnormality is seen.  IMPRESSION: Hyperaeration.  Calcified granulomas anteriorly on the left.   No active lung disease.  Original Report Authenticated By: Juline Patch, M.D.     1. Cough   2. Bronchitis       MDM  71 year old male with cough. Suspect URI, likely viral. Low clinical suspicion for serious bacterial illness. Patient has no respiratory distress on examination. Oxygen saturations are 100% on room air. Chest x-ray does not show any focal infiltrate. Doubt PE. Patient was counseled on smoking cessation. Plan symptomatic treatment at this time. Return precautions were discussed. Outpatient followup         Raeford Razor, MD 01/08/12 [phone]

## 2012-01-08 NOTE — ED Notes (Signed)
Pt c/o cough and congestion x 1 week. Pt states that he is coughing up green phlegm.

## 2012-02-29 ENCOUNTER — Emergency Department (HOSPITAL_COMMUNITY)
Admission: EM | Admit: 2012-02-29 | Discharge: 2012-02-29 | Disposition: A | Discharge status: Home / Self Care | Payer: Medicare Other | Attending: Emergency Medicine | Admitting: Emergency Medicine

## 2012-02-29 ENCOUNTER — Emergency Department (HOSPITAL_COMMUNITY): Payer: Medicare Other

## 2012-02-29 ENCOUNTER — Encounter (HOSPITAL_COMMUNITY): Payer: Self-pay | Admitting: *Deleted

## 2012-02-29 DIAGNOSIS — R10815 Periumbilic abdominal tenderness: Secondary | ICD-10-CM | POA: Insufficient documentation

## 2012-02-29 DIAGNOSIS — R070 Pain in throat: Secondary | ICD-10-CM | POA: Insufficient documentation

## 2012-02-29 DIAGNOSIS — F17200 Nicotine dependence, unspecified, uncomplicated: Secondary | ICD-10-CM | POA: Insufficient documentation

## 2012-02-29 DIAGNOSIS — K5900 Constipation, unspecified: Secondary | ICD-10-CM | POA: Insufficient documentation

## 2012-02-29 DIAGNOSIS — L089 Local infection of the skin and subcutaneous tissue, unspecified: Secondary | ICD-10-CM

## 2012-02-29 DIAGNOSIS — R1110 Vomiting, unspecified: Secondary | ICD-10-CM | POA: Insufficient documentation

## 2012-02-29 DIAGNOSIS — R1033 Periumbilical pain: Secondary | ICD-10-CM | POA: Insufficient documentation

## 2012-02-29 LAB — COMPREHENSIVE METABOLIC PANEL
ALT: 15 U/L (ref 0–53)
AST: 21 U/L (ref 0–37)
Alkaline Phosphatase: 67 U/L (ref 39–117)
CO2: 28 mEq/L (ref 19–32)
Calcium: 10 mg/dL (ref 8.4–10.5)
GFR calc non Af Amer: 63 mL/min — ABNORMAL LOW (ref >90)
Potassium: 4.6 mEq/L (ref 3.5–5.1)
Sodium: 137 mEq/L (ref 135–145)
Total Protein: 8 g/dL (ref 6.0–8.3)

## 2012-02-29 LAB — CBC
MCV: 92.6 fL (ref 78.0–100.0)
Platelets: 141 10*3/uL — ABNORMAL LOW (ref 150–400)
RBC: 4.74 MIL/uL (ref 4.22–5.81)
RDW: 13.1 % (ref 11.5–15.5)
WBC: 4.2 10*3/uL (ref 4.0–10.5)

## 2012-02-29 LAB — DIFFERENTIAL
Basophils Absolute: 0 10*3/uL (ref 0.0–0.1)
Eosinophils Relative: 7 % — ABNORMAL HIGH (ref 0–5)
Lymphocytes Relative: 46 % (ref 12–46)
Neutro Abs: 1.6 10*3/uL — ABNORMAL LOW (ref 1.7–7.7)
Neutrophils Relative %: 37 % — ABNORMAL LOW (ref 43–77)

## 2012-02-29 MED ORDER — SODIUM CHLORIDE 0.9 % IV BOLUS (SEPSIS)
1000.0000 mL | Freq: Once | INTRAVENOUS | Status: AC
  Administered 2012-02-29: 1000 mL via INTRAVENOUS

## 2012-02-29 MED ORDER — IOHEXOL 300 MG/ML  SOLN
100.0000 mL | Freq: Once | INTRAMUSCULAR | Status: AC | PRN
  Administered 2012-02-29: 100 mL via INTRAVENOUS

## 2012-02-29 MED ORDER — DOXYCYCLINE HYCLATE 100 MG PO CAPS: 100.0000 mg | ORAL_CAPSULE | Freq: Two times a day (BID) | ORAL | Status: DC

## 2012-02-29 MED ORDER — ONDANSETRON HCL 4 MG/2ML IJ SOLN
4.0000 mg | Freq: Once | INTRAMUSCULAR | Status: AC
  Administered 2012-02-29: 4 mg via INTRAVENOUS
  Filled 2012-02-29: qty 2

## 2012-02-29 MED ORDER — PROMETHAZINE HCL 25 MG PO TABS: 25.0000 mg | ORAL_TABLET | Freq: Four times a day (QID) | ORAL | Status: DC | PRN

## 2012-02-29 MED ORDER — DEXTROSE 5 % IV SOLN: 1.0000 g | Freq: Once | INTRAVENOUS | Status: DC

## 2012-02-29 MED ORDER — HYDROCODONE-ACETAMINOPHEN 5-325 MG PO TABS: 1.0000 | ORAL_TABLET | Freq: Four times a day (QID) | ORAL | Status: DC | PRN

## 2012-02-29 MED ORDER — VANCOMYCIN HCL IN DEXTROSE 1-5 GM/200ML-% IV SOLN
1000.0000 mg | Freq: Once | INTRAVENOUS | Status: AC
  Administered 2012-02-29: 1000 mg via INTRAVENOUS
  Filled 2012-02-29: qty 200

## 2012-02-29 MED ORDER — KETOROLAC TROMETHAMINE 30 MG/ML IJ SOLN
30.0000 mg | Freq: Once | INTRAMUSCULAR | Status: AC
  Administered 2012-02-29: 30 mg via INTRAVENOUS
  Filled 2012-02-29: qty 1

## 2012-02-29 MED ORDER — IOHEXOL 300 MG/ML  SOLN
40.0000 mL | Freq: Once | INTRAMUSCULAR | Status: AC | PRN
  Administered 2012-02-29: 40 mL via ORAL

## 2012-02-29 NOTE — ED Notes (Signed)
Pt states he noticed cold sore to bottom lip noticed last night. Put medicine on and lip was swollen this morning. States medicine was old. Pt states he took a laxative last night and had a "good BM" this morning by belly still hurts. Vomited x 1 this morning.

## 2012-02-29 NOTE — ED Provider Notes (Cosign Needed)
This chart was scribed for Benny Lennert, MD by Wallis Mart. The patient was seen in room APA10/APA10 and the patient's care was started at 9:56 AM.   CSN: [id number]  Arrival date & time 02/29/12  1478   First MD Initiated Contact with Patient 02/29/12 [phone]      Chief Complaint  Patient presents with  . Abdominal Pain  . Oral Swelling    (Consider location/radiation/quality/duration/timing/severity/associated sxs/prior treatment) Patient is a 71 y.o. male presenting with abdominal pain. The history is provided by the patient.  Abdominal Pain The primary symptoms of the illness include abdominal pain and vomiting. The current episode started yesterday. The onset of the illness was sudden. The problem has been gradually worsening.  The abdominal pain began yesterday. The pain came on suddenly. The abdominal pain has been gradually worsening since its onset. The abdominal pain is located in the periumbilical region. The abdominal pain does not radiate. The severity of the abdominal pain is 8/10. The abdominal pain is relieved by nothing.  The vomiting began today. Vomiting occurred once. The emesis contains stomach contents.  Additional symptoms associated with the illness include constipation.     Dennis Ruiz is a 71 y.o. male who presents to the Emergency Department complaining of sudden onset, persistence of constant, gradually worsening, moderate to severe lower lip swelling onset this morning.  Pt states that he noticed a cold sore on his lower lip last night and applied OTC medication, woke up this morning and lip was swollen, pt states that the medicine was old. Pt also c/o sudden onset, constant, gradually worsening abdominal pain onset last night.  States he felt constipated and took a laxative last night and vomited this morning. Pt rates abdominal pain as 7 or 8 out of 10. Pt denies fever.   There are no other associated symptoms and no other alleviating or aggravating  factors.  Past Medical History  Diagnosis Date  . Back pain   . Chest pain     Past Surgical History  Procedure Date  . Hernia repair   . Transurethral resection of prostate     Family History  Problem Relation Age of Onset  . Cancer Father     History  Substance Use Topics  . Smoking status: Current Everyday Smoker  . Smokeless tobacco: Not on file  . Alcohol Use: No      Review of Systems  Gastrointestinal: Positive for vomiting, abdominal pain and constipation.  All other systems reviewed and are negative.   10 Systems reviewed and all are negative for acute change except as noted in the HPI.    Allergies  Review of patient's allergies indicates no known allergies.  Home Medications   Current Outpatient Rx  Name Route Sig Dispense Refill  . ASPIRIN EC 81 MG PO TBEC Oral Take 81 mg by mouth every morning.    Marland Kitchen BISACODYL 5 MG PO TBEC Oral Take 5 mg by mouth as needed. FOR RELIEF    . VITAMIN D 1000 UNITS PO TABS Oral Take 1,000 Units by mouth every morning.    Marland Kitchen OMEGA-3 FATTY ACIDS 1000 MG PO CAPS Oral Take 1 capsule by mouth daily.      Marland Kitchen HYDROCODONE-ACETAMINOPHEN 5-500 MG PO TABS Oral Take 0.5-1 tablets by mouth daily as needed. For pain    . GERITOL TONIC PO Oral Take 5 mLs by mouth every morning.    Marland Kitchen TADALAFIL 5 MG PO TABS Oral Take 5 mg by mouth  daily as needed. For intercourse    . VITAMIN E PO Oral Take 1 tablet by mouth every morning.      BP 121/80  Pulse 87  Temp(Src) 98 F (36.7 C) (Oral)  Resp 16  Ht 6\' 3"  (1.905 m)  Wt 220 lb (99.791 kg)  BMI 27.50 kg/m2  SpO2 97%  Physical Exam  Nursing note and vitals reviewed. Constitutional: He is oriented to person, place, and time. He appears well-developed and well-nourished. No distress.  HENT:  Head: Normocephalic and atraumatic.       Lower right lip is tender and swollen, Interior to lower right lip is inflamed  Eyes: Conjunctivae and EOM are normal. No scleral icterus.  Neck: Neck supple.  No tracheal deviation present. No thyromegaly present.  Cardiovascular: Normal rate and regular rhythm.  Exam reveals no gallop and no friction rub.   No murmur heard. Pulmonary/Chest: Effort normal. No stridor. No respiratory distress. He has no wheezes. He has no rales. He exhibits no tenderness.  Abdominal: Soft. He exhibits no distension. There is tenderness. There is no rebound.       tender to periumbilical area   Musculoskeletal: Normal range of motion. He exhibits no edema.  Lymphadenopathy:    He has no cervical adenopathy.  Neurological: He is alert and oriented to person, place, and time. No sensory deficit. Coordination normal.  Skin: Skin is warm and dry. No rash noted. No erythema.  Psychiatric: He has a normal mood and affect. His behavior is normal.    ED Course  Procedures (including critical care time) DIAGNOSTIC STUDIES: Oxygen Saturation is 97% on room air, norml by my interpretation.    COORDINATION OF CARE:  12:39 PM: EDP at pt's bedside, pt's abdominal pain is the same, vomiting has subsided. All results reviewed and discussed with pt, questions answered, pt agreeable with plan.   Labs Reviewed  CBC - Abnormal; Notable for the following:    Platelets 141 (*)    All other components within normal limits  DIFFERENTIAL - Abnormal; Notable for the following:    Neutrophils Relative 37 (*)    Neutro Abs 1.6 (*)    Eosinophils Relative 7 (*)    All other components within normal limits  COMPREHENSIVE METABOLIC PANEL - Abnormal; Notable for the following:    Glucose, Bld 110 (*)    GFR calc non Af Amer 63 (*)    GFR calc Af Amer 73 (*)    All other components within normal limits  LIPASE, BLOOD   Ct Abdomen Pelvis W Contrast  02/29/2012  *RADIOLOGY REPORT*  Clinical Data: Abdominal pain, nausea and vomiting.  CT ABDOMEN AND PELVIS WITH CONTRAST  Technique:  Multidetector CT imaging of the abdomen and pelvis was performed following the standard protocol during  bolus administration of intravenous contrast.  Contrast: OMNIPAQUE IOHEXOL 300 MG/ML  SOLN  Comparison: None.  Findings: The liver, gallbladder, pancreas, spleen, adrenal glands and kidneys are unremarkable.  Benign appearing inferior right renal cyst present.  Bowel loops are normal caliber and show no evidence of obstruction or inflammation.  No inflammatory process, free fluid or abscess is identified.  The abdominal aorta is of normal caliber and shows mild atherosclerotic changes.  The bladder is unremarkable.  No hernias. No bony lesions.  Osteoarthritis of both hips.  IMPRESSION: No acute findings in the abdomen or pelvis.  Original Report Authenticated By: Reola Calkins, M.D.     No diagnosis found.    MDM  The chart was scribed for me under my direct supervision.  I personally performed the history, physical, and medical decision making and all procedures in the evaluation of this patient.Benny Lennert, MD 02/29/12 1247

## 2012-02-29 NOTE — Discharge Instructions (Signed)
Follow up with your md in 2-3 days °

## 2012-03-01 ENCOUNTER — Emergency Department (HOSPITAL_COMMUNITY): Payer: Medicare Other

## 2012-03-01 ENCOUNTER — Observation Stay (HOSPITAL_COMMUNITY)
Admission: EM | Admit: 2012-03-01 | Discharge: 2012-03-02 | Disposition: A | Discharge status: Home / Self Care | Payer: Medicare Other | Attending: Internal Medicine | Admitting: Internal Medicine

## 2012-03-01 ENCOUNTER — Encounter (HOSPITAL_COMMUNITY): Payer: Self-pay | Admitting: Emergency Medicine

## 2012-03-01 DIAGNOSIS — I10 Essential (primary) hypertension: Secondary | ICD-10-CM | POA: Diagnosis present

## 2012-03-01 DIAGNOSIS — Z79899 Other long term (current) drug therapy: Secondary | ICD-10-CM | POA: Insufficient documentation

## 2012-03-01 DIAGNOSIS — L03211 Cellulitis of face: Principal | ICD-10-CM | POA: Diagnosis present

## 2012-03-01 DIAGNOSIS — R109 Unspecified abdominal pain: Secondary | ICD-10-CM | POA: Insufficient documentation

## 2012-03-01 DIAGNOSIS — Z7982 Long term (current) use of aspirin: Secondary | ICD-10-CM | POA: Insufficient documentation

## 2012-03-01 DIAGNOSIS — L0201 Cutaneous abscess of face: Principal | ICD-10-CM | POA: Insufficient documentation

## 2012-03-01 DIAGNOSIS — K130 Diseases of lips: Secondary | ICD-10-CM

## 2012-03-01 DIAGNOSIS — R110 Nausea: Secondary | ICD-10-CM | POA: Insufficient documentation

## 2012-03-01 LAB — CBC
Hemoglobin: 13.5 g/dL (ref 13.0–17.0)
MCH: 31.8 pg (ref 26.0–34.0)
MCHC: 34.3 g/dL (ref 30.0–36.0)
Platelets: 139 10*3/uL — ABNORMAL LOW (ref 150–400)
RDW: 13.3 % (ref 11.5–15.5)

## 2012-03-01 LAB — BASIC METABOLIC PANEL
Calcium: 9.4 mg/dL (ref 8.4–10.5)
GFR calc Af Amer: 69 mL/min — ABNORMAL LOW (ref >90)
GFR calc non Af Amer: 60 mL/min — ABNORMAL LOW (ref >90)
Potassium: 4.4 mEq/L (ref 3.5–5.1)
Sodium: 141 mEq/L (ref 135–145)

## 2012-03-01 MED ORDER — DIPHENHYDRAMINE HCL 50 MG/ML IJ SOLN
25.0000 mg | Freq: Once | INTRAMUSCULAR | Status: AC
  Administered 2012-03-01: 25 mg via INTRAVENOUS
  Filled 2012-03-01: qty 1

## 2012-03-01 MED ORDER — ASPIRIN EC 81 MG PO TBEC
81.0000 mg | DELAYED_RELEASE_TABLET | Freq: Every day | ORAL | Status: DC
Start: 2012-03-01 — End: 2012-03-02

## 2012-03-01 MED ORDER — METHYLPREDNISOLONE SODIUM SUCC 125 MG IJ SOLR
125.0000 mg | Freq: Once | INTRAMUSCULAR | Status: AC
  Administered 2012-03-01: 125 mg via INTRAVENOUS
  Filled 2012-03-01: qty 2

## 2012-03-01 MED ORDER — CLINDAMYCIN PHOSPHATE 600 MG/50ML IV SOLN
INTRAVENOUS | Status: AC
  Filled 2012-03-01: qty 50

## 2012-03-01 MED ORDER — SODIUM CHLORIDE 0.9 % IV SOLN: INTRAVENOUS | Status: DC

## 2012-03-01 MED ORDER — ACETAMINOPHEN 650 MG RE SUPP: 650.0000 mg | Freq: Four times a day (QID) | RECTAL | Status: DC | PRN

## 2012-03-01 MED ORDER — PROMETHAZINE HCL 12.5 MG PO TABS: 12.5000 mg | ORAL_TABLET | Freq: Four times a day (QID) | ORAL | Status: DC | PRN

## 2012-03-01 MED ORDER — ENOXAPARIN SODIUM 40 MG/0.4ML ~~LOC~~ SOLN: 40.0000 mg | SUBCUTANEOUS | Status: DC

## 2012-03-01 MED ORDER — HYDROCODONE-ACETAMINOPHEN 5-325 MG PO TABS
1.0000 | ORAL_TABLET | Freq: Four times a day (QID) | ORAL | Status: DC | PRN
  Administered 2012-03-01: 1 via ORAL
  Filled 2012-03-01: qty 1

## 2012-03-01 MED ORDER — ACETAMINOPHEN 325 MG PO TABS: 650.0000 mg | ORAL_TABLET | Freq: Four times a day (QID) | ORAL | Status: DC | PRN

## 2012-03-01 MED ORDER — IOHEXOL 300 MG/ML  SOLN
75.0000 mL | Freq: Once | INTRAMUSCULAR | Status: AC | PRN
  Administered 2012-03-01: 75 mL via INTRAVENOUS

## 2012-03-01 MED ORDER — MAGNESIUM HYDROXIDE 400 MG/5ML PO SUSP: 30.0000 mL | Freq: Every day | ORAL | Status: DC | PRN

## 2012-03-01 MED ORDER — SODIUM CHLORIDE 0.9 % IV SOLN
INTRAVENOUS | Status: DC
  Administered 2012-03-01: 11:00:00 via INTRAVENOUS

## 2012-03-01 MED ORDER — CLINDAMYCIN PHOSPHATE 600 MG/50ML IV SOLN
600.0000 mg | Freq: Four times a day (QID) | INTRAVENOUS | Status: DC
  Administered 2012-03-01 – 2012-03-02 (×2): 600 mg via INTRAVENOUS
  Filled 2012-03-01 (×4): qty 50

## 2012-03-01 MED ORDER — DIAZEPAM 5 MG PO TABS
5.0000 mg | ORAL_TABLET | Freq: Every day | ORAL | Status: DC
  Administered 2012-03-01: 5 mg via ORAL
  Filled 2012-03-01: qty 1

## 2012-03-01 MED ORDER — CLINDAMYCIN PHOSPHATE 900 MG/50ML IV SOLN
900.0000 mg | Freq: Once | INTRAVENOUS | Status: AC
  Administered 2012-03-01: 900 mg via INTRAVENOUS
  Filled 2012-03-01: qty 50

## 2012-03-01 NOTE — ED Notes (Signed)
Attempted to give report to [facility] on 300. Will call back when available.

## 2012-03-01 NOTE — ED Notes (Signed)
Pt seen here yesterday for same. States swelling is worse today.

## 2012-03-01 NOTE — H&P (Signed)
Hospital Admission Note Date: 03/01/2012  Patient name: Dennis Ruiz Medical record number: [id number] Date of birth: 07-Sep-1941 Age: 71 y.o. Gender: male PCP: No primary provider on file.  Attending physician: Christiane Ha, MD  Chief Complaint: Lip swelling  History of Present Illness:  Dennis Ruiz is an 71 y.o. male who comes to the emergency room with lip swelling. He was seen in the emergency room yesterday with both lip swelling (mainly lower lip) and abdominal pain. He had a cold sore a few days ago. He had a CT scan of the abdomen yesterday, and was sent home on trimethoprim/sulfamethoxazole and Vicodin as needed. Today, his abdominal pain has resolved, but he has extension of the swelling to his upper lip and his chin. He's also had some purulent drainage. No fevers or chills. No vomiting or diarrhea. No tongue swelling or shortness of breath. No symptoms of tooth abscess.  Past medical history: Hypertension  Meds:  See medicine reconciliation  Allergies: Review of patient's allergies indicates no known allergies. History   Social History  . Marital Status: Married    Spouse Name: N/A    Number of Children: N/A  . Years of Education: N/A   Occupational History  . Not on file.   Social History Main Topics  . Smoking status: Current Everyday Smoker  . Smokeless tobacco: Not on file  . Alcohol Use: No  . Drug Use: No  . Sexually Active: Not on file   Other Topics Concern  . Not on file   Social History Narrative   WidowedNo regular exercise   Family History  Problem Relation Age of Onset  . Cancer Father    Past Surgical History  Procedure Date  . Hernia repair   . Transurethral resection of prostate     Review of Systems: Systems reviewed and as per HPI, otherwise negative.  Physical Exam: Blood pressure 120/88, pulse 79, temperature 98.1 F (36.7 C), temperature source Oral, resp. rate 16, height 6\' 3"  (1.905 m), weight 104.327 kg (230 lb),  SpO2 100.00%. BP 120/88  Pulse 79  Temp(Src) 98.1 F (36.7 C) (Oral)  Resp 16  Ht 6\' 3"  (1.905 m)  Wt 104.327 kg (230 lb)  BMI 28.75 kg/m2  SpO2 100%  General Appearance:    Alert, cooperative, no distress, appears stated age  Head:    Normocephalic, without obvious abnormality, atraumatic  Eyes:    PERRL, conjunctiva/corneas clear, EOM's intact, fundi    benign, both eyes       Ears:    Normal external ear canals, both ears  Nose:   Nares normal, septum midline, mucosa normal, no drainage    or sinus tenderness  Throat:   lips with swelling, extending inferiorly to the chin. Erythema of the lips and chin and induration with some yellowish crusting. no tongue swelling. Airway unobstructed. No fluctuance  Neck:   Supple, symmetrical, trachea midline, no adenopathy;       thyroid:  No enlargement/tenderness/nodules; no carotid   bruit or JVD  Back:     Symmetric, no curvature, ROM normal, no CVA tenderness  Lungs:     Clear to auscultation bilaterally, respirations unlabored  Chest wall:    No tenderness or deformity  Heart:    Regular rate and rhythm, S1 and S2 normal, no murmur, rub   or gallop  Abdomen:     Soft, non-tender, bowel sounds active all four quadrants,    no masses, no organomegaly  Genitalia:  deferred   Rectal:   deferred   Extremities:   Extremities normal, atraumatic, no cyanosis or edema  Pulses:   2+ and symmetric all extremities  Skin:   Skin color, texture, turgor normal, no rashes or lesions  Lymph nodes:   Cervical, supraclavicular, and axillary nodes normal  Neurologic:   CNII-XII intact. Normal strength, sensation and reflexes      throughout    Psychiatric: Normal affect.  Lab results: Basic Metabolic Panel:  Basename 03/01/12 1027 02/29/12 1027  NA 141 137  K 4.4 4.6  CL 105 101  CO2 28 28  GLUCOSE 97 110*  BUN 12 13  CREATININE 1.18 1.13  CALCIUM 9.4 10.0  MG -- --  PHOS -- --   Liver Function Tests:  Basename 02/29/12 1027  AST 21    ALT 15  ALKPHOS 67  BILITOT 0.4  PROT 8.0  ALBUMIN 4.4    Basename 02/29/12 1027  LIPASE 51  AMYLASE --   No results found for this basename: AMMONIA:2 in the last 72 hours CBC:  Basename 03/01/12 1027 02/29/12 1027  WBC 4.1 4.2  NEUTROABS -- 1.6*  HGB 13.5 15.0  HCT 39.4 43.9  MCV 92.9 92.6  PLT 139* 141*   Imaging results:  Ct Abdomen Pelvis W Contrast  02/29/2012  *RADIOLOGY REPORT*  Clinical Data: Abdominal pain, nausea and vomiting.  CT ABDOMEN AND PELVIS WITH CONTRAST  Technique:  Multidetector CT imaging of the abdomen and pelvis was performed following the standard protocol during bolus administration of intravenous contrast.  Contrast: OMNIPAQUE IOHEXOL 300 MG/ML  SOLN  Comparison: None.  Findings: The liver, gallbladder, pancreas, spleen, adrenal glands and kidneys are unremarkable.  Benign appearing inferior right renal cyst present.  Bowel loops are normal caliber and show no evidence of obstruction or inflammation.  No inflammatory process, free fluid or abscess is identified.  The abdominal aorta is of normal caliber and shows mild atherosclerotic changes.  The bladder is unremarkable.  No hernias. No bony lesions.  Osteoarthritis of both hips.  IMPRESSION: No acute findings in the abdomen or pelvis.  Original Report Authenticated By: Reola Calkins, M.D.   Ct Maxillofacial W/cm  03/01/2012  *RADIOLOGY REPORT*  Clinical Data: Lower lobe swelling, drainage.  CT MAXILLOFACIAL WITH CONTRAST  Technique:  Multidetector CT imaging of the maxillofacial structures was performed with intravenous contrast. Multiplanar CT image reconstructions were also generated.  Contrast: 75mL OMNIPAQUE IOHEXOL 300 MG/ML  SOLN  Comparison: None  Findings: There is diffuse soft tissue edema/stranding within the lower left without well-defined fluid collection.  Findings are suspicious for cellulitis.  No acute bony abnormality.  Old left medial orbital wall blowout fracture.  Mild mucosal  thickening throughout the paranasal sinuses.  No air fluid levels.  Orbital soft tissues are unremarkable.  IMPRESSION: Soft tissue edema/stranding within the lower lip soft tissues without well-defined fluid collection.  Question cellulitis.  Mild chronic sinusitis.  Original Report Authenticated By: Cyndie Chime, M.D.    Assessment & Plan:   *Facial cellulitis: Will observe overnight. Give IV clindamycin and supportive care.   Benign hypertension: Continue outpatient medications.   Radonna Bracher L 03/01/2012, 4:09 PM

## 2012-03-01 NOTE — ED Provider Notes (Signed)
History   This chart was scribed for Dennis Anger, DO by [name]. The patient was seen in room [facility]05.   CSN: [id number]  Arrival date & time 03/01/12  [id number]   First MD Initiated Contact with Patient 03/01/12 [phone]      Chief Complaint  Patient presents with  . Oral Swelling    HPI Pt seen at 1015.  Per pt, c/o gradual onset and worsening of persistent lower lip "swelling" for the past 2 days.  Pt states the swelling has spread to his chin, and is associated with "draining pus."  Pt was seen in the ED yesterday for same, rx abx, which he endorses he is taking as prescribed.  Pt states he had "a cold sore" on his lower lip before his symptoms began.  Denies fever, no intra-oral edema, no hoarse voice, no sore throat, no facial injury, no new meds before symptoms began.    Past Medical History  Diagnosis Date  . Back pain   . Chest pain     Past Surgical History  Procedure Date  . Hernia repair   . Transurethral resection of prostate     Family History  Problem Relation Age of Onset  . Cancer Father     History  Substance Use Topics  . Smoking status: Current Everyday Smoker  . Smokeless tobacco: Not on file  . Alcohol Use: No     Review of Systems ROS: Statement: All systems negative except as marked or noted in the HPI; Constitutional: Negative for fever and chills. ; ; Eyes: Negative for eye pain, redness and discharge. ; ; ENMT: Negative for ear pain, hoarseness, nasal congestion, sinus pressure and sore throat. ; ; Cardiovascular: Negative for chest pain, palpitations, diaphoresis, dyspnea and peripheral edema. ; ; Respiratory: Negative for cough, wheezing and stridor. ; ; Gastrointestinal: Negative for nausea, vomiting, diarrhea, abdominal pain, blood in stool, hematemesis, jaundice and rectal bleeding. . ; ; Genitourinary: Negative for dysuria, flank pain and hematuria. ; ; Musculoskeletal: Negative for back pain and neck pain. Negative for swelling and  trauma.; ; Skin: +lower lip swelling.  Negative for pruritus, rash, abrasions, blisters, bruising and skin lesion.; ; Neuro: Negative for headache, lightheadedness and neck stiffness. Negative for weakness, altered level of consciousness , altered mental status, extremity weakness, paresthesias, involuntary movement, seizure and syncope.     Allergies  Review of patient's allergies indicates no known allergies.  Home Medications   Current Outpatient Rx  Name Route Sig Dispense Refill  . ASPIRIN EC 81 MG PO TBEC Oral Take 81 mg by mouth every morning.    Marland Kitchen DIAZEPAM 5 MG PO TABS Oral Take 5 mg by mouth at bedtime.    Marland Kitchen DOXYCYCLINE HYCLATE 100 MG PO CAPS Oral Take 1 capsule (100 mg total) by mouth 2 (two) times daily. 20 capsule 0  . OMEGA-3 FATTY ACIDS 1000 MG PO CAPS Oral Take 1 capsule by mouth every morning.     Marland Kitchen HYDROCODONE-ACETAMINOPHEN 5-325 MG PO TABS Oral Take 1 tablet by mouth every 6 (six) hours as needed for pain. 20 tablet 0  . HYDROCODONE-ACETAMINOPHEN 7.5-750 MG PO TABS Oral Take 1 tablet by mouth 2 (two) times daily as needed. For pain    . GERITOL COMPLETE PO Oral Take 1 tablet by mouth every morning.    Marland Kitchen PROMETHAZINE HCL 25 MG PO TABS Oral Take 1 tablet (25 mg total) by mouth every 6 (six) hours as needed for nausea. 15 tablet  0  . TRIAMTERENE-HCTZ 37.5-25 MG PO TABS Oral Take 0.5 tablets by mouth daily as needed. For high blood pressure    . VITAMIN E PO Oral Take 1 tablet by mouth every morning.      BP 121/68  Pulse 70  Temp(Src) 98.1 F (36.7 C) (Oral)  Resp 18  Ht 6\' 3"  (1.905 m)  Wt 230 lb (104.327 kg)  BMI 28.75 kg/m2  SpO2 98%  Physical Exam 1015: Physical examination:  Nursing notes reviewed; Vital signs and O2 SAT reviewed;  Constitutional: Well developed, Well nourished, Well hydrated, In no acute distress; Head:  Normocephalic, atraumatic; Face: +entire lower lip and upper chin TTP with tense edema and purulent drainage, no visualized open wounds, no  bleeding, no erythema, no blisters.  No intra-oral edema, no hoarse voice, no drooling or stridor; Eyes: EOMI, PERRL, No scleral icterus; ENMT: Mouth and pharynx normal, Mucous membranes moist; Neck: Supple, Full range of motion, No lymphadenopathy; Cardiovascular: Regular rate and rhythm, No murmur, rub, or gallop; Respiratory: Breath sounds clear & equal bilaterally, No rales, rhonchi, wheezes, speaking full sentences, Normal respiratory effort/excursion; Chest: Nontender, Movement normal;; Extremities: Pulses normal, No tenderness, No edema, No calf edema or asymmetry.; Neuro: AA&Ox3, Major CN grossly intact. Speech clear. No gross focal motor or sensory deficits in extremities.; Skin: Color normal, Warm, Dry   ED Course  Procedures    MDM  MDM Reviewed: previous chart, nursing note and vitals Reviewed previous: labs Interpretation: labs and CT scan   Results for orders placed during the hospital encounter of 03/01/12  CBC      Component Value Range   WBC 4.1  4.0 - 10.5 (K/uL)   RBC 4.24  4.22 - 5.81 (MIL/uL)   Hemoglobin 13.5  13.0 - 17.0 (g/dL)   HCT 16.1  09.6 - 04.5 (%)   MCV 92.9  78.0 - 100.0 (fL)   MCH 31.8  26.0 - 34.0 (pg)   MCHC 34.3  30.0 - 36.0 (g/dL)   RDW 40.9  81.1 - 91.4 (%)   Platelets 139 (*) 150 - 400 (K/uL)  BASIC METABOLIC PANEL      Component Value Range   Sodium 141  135 - 145 (mEq/L)   Potassium 4.4  3.5 - 5.1 (mEq/L)   Chloride 105  96 - 112 (mEq/L)   CO2 28  19 - 32 (mEq/L)   Glucose, Bld 97  70 - 99 (mg/dL)   BUN 12  6 - 23 (mg/dL)   Creatinine, Ser 7.82  0.50 - 1.35 (mg/dL)   Calcium 9.4  8.4 - 95.6 (mg/dL)   GFR calc non Af Amer 60 (*) >90 (mL/min)   GFR calc Af Amer 69 (*) >90 (mL/min)    Ct Maxillofacial W/cm 03/01/2012  *RADIOLOGY REPORT*  Clinical Data: Lower lobe swelling, drainage.  CT MAXILLOFACIAL WITH CONTRAST  Technique:  Multidetector CT imaging of the maxillofacial structures was performed with intravenous contrast. Multiplanar CT  image reconstructions were also generated.  Contrast: 75mL OMNIPAQUE IOHEXOL 300 MG/ML  SOLN  Comparison: None  Findings: There is diffuse soft tissue edema/stranding within the lower left without well-defined fluid collection.  Findings are suspicious for cellulitis.  No acute bony abnormality.  Old left medial orbital wall blowout fracture.  Mild mucosal thickening throughout the paranasal sinuses.  No air fluid levels.  Orbital soft tissues are unremarkable.  IMPRESSION: Soft tissue edema/stranding within the lower lip soft tissues without well-defined fluid collection.  Question cellulitis.  Mild chronic sinusitis.  Original Report Authenticated By: Cyndie Chime, M.D.     [phone]:  Does not appear to be allergic reaction, but will tx with benadryl/steroid in addition to tx for cellulitis with IV abx (which is the more likely dx given hx of "blister" on his right lower lip before symptoms started).  No abscess via CT scan.  Dx testing d/w pt.  Questions answered.  Verb understanding, agreeable to admit.  T/C to [facility]. Lendell Caprice, case discussed, including:  HPI, pertinent PM/SHx, VS/PE, dx testing, ED course and treatment:  Agreeable to admit.     I personally performed the services described in this documentation, which was scribed in my presence. The recorded information has been reviewed and considered. Sayan Aldava Allison Quarry, DO 03/02/12 2002

## 2012-03-02 DIAGNOSIS — L0201 Cutaneous abscess of face: Secondary | ICD-10-CM

## 2012-03-02 DIAGNOSIS — I10 Essential (primary) hypertension: Secondary | ICD-10-CM

## 2012-03-02 DIAGNOSIS — L03211 Cellulitis of face: Secondary | ICD-10-CM

## 2012-03-02 MED ORDER — ACETAMINOPHEN 325 MG PO TABS: 650.0000 mg | ORAL_TABLET | Freq: Four times a day (QID) | ORAL | Status: DC | PRN

## 2012-03-02 MED ORDER — CLINDAMYCIN HCL 300 MG PO CAPS: 300.0000 mg | ORAL_CAPSULE | Freq: Four times a day (QID) | ORAL | Status: AC

## 2012-03-02 NOTE — Progress Notes (Signed)
UR Chart Review Completed  

## 2012-03-02 NOTE — Progress Notes (Deleted)
Physician Discharge Summary   Patient ID:  Dennis Ruiz  MRN: [id number]  DOB/AGE: 11/19/1940 71 y.o.  Admit date: 03/01/2012  Discharge date: 03/02/2012  Discharge Diagnoses:  Principal Problem:  *Facial cellulitis  Active Problems:  Benign hypertension   Medication List  As of 03/02/2012 8:59 AM    STOP taking these medications          doxycycline 100 MG capsule      HYDROcodone-acetaminophen 5-325 MG per tablet       TAKE these medications          acetaminophen 325 MG tablet      Commonly known as: TYLENOL      Take 2 tablets (650 mg total) by mouth every 6 (six) hours as needed (or Fever >/= 101).      aspirin EC 81 MG tablet      Take 81 mg by mouth every morning.      clindamycin 300 MG capsule      Commonly known as: CLEOCIN      Take 1 capsule (300 mg total) by mouth 4 (four) times daily.      diazepam 5 MG tablet      Commonly known as: VALIUM      Take 5 mg by mouth at bedtime.      fish oil-omega-3 fatty acids 1000 MG capsule      Take 1 capsule by mouth every morning.      GERITOL COMPLETE PO      Take 1 tablet by mouth every morning.      HYDROcodone-acetaminophen 7.5-750 MG per tablet      Commonly known as: VICODIN ES      Take 1 tablet by mouth 2 (two) times daily as needed. For pain      promethazine 25 MG tablet      Commonly known as: PHENERGAN      Take 1 tablet (25 mg total) by mouth every 6 (six) hours as needed for nausea.      triamterene-hydrochlorothiazide 37.5-25 MG per tablet      Commonly known as: MAXZIDE-25      Take 0.5 tablets by mouth daily as needed. For high blood pressure      VITAMIN E PO      Take 1 tablet by mouth every morning.         Discharge Orders    Future Orders  Please Complete By  Expires    Diet - low sodium heart healthy      Discharge instructions      Comments:    Warm compress to draining areas as needed    Activity as tolerated - No restrictions        Follow-up Information    Follow up with your doctor.  (If symptoms worsen)         Disposition: 01-Home or Self Care  Discharged Condition: stable  Consults: none  Labs:  Results for orders placed during the hospital encounter of 03/01/12 (from the past 48 hour(s))   CBC Status: Abnormal    Collection Time    03/01/12 10:27 AM   Component  Value  Range  Comment    WBC  4.1  4.0 - 10.5 (K/uL)     RBC  4.24  4.22 - 5.81 (MIL/uL)     Hemoglobin  13.5  13.0 - 17.0 (g/dL)     HCT  39.4  39.0 - 52.0 (%)     MCV  92.9    78.0 - 100.0 (fL)     MCH  31.8  26.0 - 34.0 (pg)     MCHC  34.3  30.0 - 36.0 (g/dL)     RDW  13.3  11.5 - 15.5 (%)     Platelets  139 (*)  150 - 400 (K/uL)    BASIC METABOLIC PANEL Status: Abnormal    Collection Time    03/01/12 10:27 AM   Component  Value  Range  Comment    Sodium  141  135 - 145 (mEq/L)     Potassium  4.4  3.5 - 5.1 (mEq/L)     Chloride  105  96 - 112 (mEq/L)     CO2  28  19 - 32 (mEq/L)     Glucose, Bld  97  70 - 99 (mg/dL)     BUN  12  6 - 23 (mg/dL)     Creatinine, Ser  1.18  0.50 - 1.35 (mg/dL)     Calcium  9.4  8.4 - 10.5 (mg/dL)     GFR calc non Af Amer  60 (*)  >90 (mL/min)     GFR calc Af Amer  69 (*)  >90 (mL/min)     Diagnostics: Ct Abdomen Pelvis W Contrast  02/29/2012 *RADIOLOGY REPORT* Clinical Data: Abdominal pain, nausea and vomiting. CT ABDOMEN AND PELVIS WITH CONTRAST Technique: Multidetector CT imaging of the abdomen and pelvis was performed following the standard protocol during bolus administration of intravenous contrast. Contrast: 100mL OMNIPAQUE IOHEXOL 300 MG/ML SOLN Comparison: None. Findings: The liver, gallbladder, pancreas, spleen, adrenal glands and kidneys are unremarkable. Benign appearing inferior right renal cyst present. Bowel loops are normal caliber and show no evidence of obstruction or inflammation. No inflammatory process, free fluid or abscess is identified. The abdominal aorta is of normal caliber and shows mild atherosclerotic changes. The bladder is unremarkable.  No hernias. No bony lesions. Osteoarthritis of both hips. IMPRESSION: No acute findings in the abdomen or pelvis. Original Report Authenticated By: GLENN T. YAMAGATA, M.D.  Ct Maxillofacial W/cm  03/01/2012 *RADIOLOGY REPORT* Clinical Data: Lower lobe swelling, drainage. CT MAXILLOFACIAL WITH CONTRAST Technique: Multidetector CT imaging of the maxillofacial structures was performed with intravenous contrast. Multiplanar CT image reconstructions were also generated. Contrast: 75mL OMNIPAQUE IOHEXOL 300 MG/ML SOLN Comparison: None Findings: There is diffuse soft tissue edema/stranding within the lower left without well-defined fluid collection. Findings are suspicious for cellulitis. No acute bony abnormality. Old left medial orbital wall blowout fracture. Mild mucosal thickening throughout the paranasal sinuses. No air fluid levels. Orbital soft tissues are unremarkable. IMPRESSION: Soft tissue edema/stranding within the lower lip soft tissues without well-defined fluid collection. Question cellulitis. Mild chronic sinusitis. Original Report Authenticated By: KEVIN G. DOVER, M.D.  Procedures: none  Full Code  Hospital Course:  See H&P for complete admission details. The patient is a pleasant 71-year-old black male who had had several day history of lip swelling. He was seen in the emergency room the day prior to admission for the lip swelling and also had abdominal pain which has resolved. Apparently, he had a cold sore and self treated with over-the-counter topical therapy. The swelling initially started on the lower lip that spread to the upper lip and down his chin. He had no fevers chills. There was some purulent drainage. CT scan of the maxillofacial bones and soft tissues showed no abscess. 2 soft tissue swelling consistent with cellulitis. Patient was admitted overnight and started on clindamycin. Prior to admission he had been   on 2-3 doses of Bactrim. Today, his cellulitis is much improved and he has  been otherwise medically stable. He sees an urgent care physician in [place] occasionally and I've encouraged him to followup if symptoms worsen, or if he has any reaction to the antibiotic.  Discharge Exam:  Blood pressure 118/65, pulse 60, temperature 97.7 F (36.5 C), temperature source Oral, resp. rate 16, height 6' 3" (1.905 m), weight 98.657 kg (217 lb 8 oz), SpO2 94.00%.  Lips much less edematous, less red. Face less red. No abscess, fluctuance, drainage.  Signed:  Zonie Crutcher L  03/02/2012, 8:59 AM     

## 2012-03-02 NOTE — Discharge Summary (Signed)
Physician Discharge Summary   Patient ID:  Dennis Ruiz  MRN: [id number]  DOB/AGE: [id number] 72 y.o.  Admit date: 03/01/2012  Discharge date: 03/02/2012  Discharge Diagnoses:  Principal Problem:  *Facial cellulitis  Active Problems:  Benign hypertension   Medication List  As of 03/02/2012 8:59 AM    STOP taking these medications          doxycycline 100 MG capsule      HYDROcodone-acetaminophen 5-325 MG per tablet       TAKE these medications          acetaminophen 325 MG tablet      Commonly known as: TYLENOL      Take 2 tablets (650 mg total) by mouth every 6 (six) hours as needed (or Fever >/= 101).      aspirin EC 81 MG tablet      Take 81 mg by mouth every morning.      clindamycin 300 MG capsule      Commonly known as: CLEOCIN      Take 1 capsule (300 mg total) by mouth 4 (four) times daily.      diazepam 5 MG tablet      Commonly known as: VALIUM      Take 5 mg by mouth at bedtime.      fish oil-omega-3 fatty acids 1000 MG capsule      Take 1 capsule by mouth every morning.      GERITOL COMPLETE PO      Take 1 tablet by mouth every morning.      HYDROcodone-acetaminophen 7.5-750 MG per tablet      Commonly known as: VICODIN ES      Take 1 tablet by mouth 2 (two) times daily as needed. For pain      promethazine 25 MG tablet      Commonly known as: PHENERGAN      Take 1 tablet (25 mg total) by mouth every 6 (six) hours as needed for nausea.      triamterene-hydrochlorothiazide 37.5-25 MG per tablet      Commonly known as: MAXZIDE-25      Take 0.5 tablets by mouth daily as needed. For high blood pressure      VITAMIN E PO      Take 1 tablet by mouth every morning.         Discharge Orders    Future Orders  Please Complete By  Expires    Diet - low sodium heart healthy      Discharge instructions      Comments:    Warm compress to draining areas as needed    Activity as tolerated - No restrictions        Follow-up Information    Follow up with your doctor.  (If symptoms worsen)         Disposition: 01-Home or Self Care  Discharged Condition: stable  Consults: none  Labs:  Results for orders placed during the hospital encounter of 03/01/12 (from the past 48 hour(s))   CBC Status: Abnormal    Collection Time    03/01/12 10:27 AM   Component  Value  Range  Comment    WBC  4.1  4.0 - 10.5 (K/uL)     RBC  4.24  4.22 - 5.81 (MIL/uL)     Hemoglobin  13.5  13.0 - 17.0 (g/dL)     HCT  40.9  81.1 - 52.0 (%)     MCV  92.9  78.0 - 100.0 (fL)     MCH  31.8  26.0 - 34.0 (pg)     MCHC  34.3  30.0 - 36.0 (g/dL)     RDW  14.7  82.9 - 15.5 (%)     Platelets  139 (*)  150 - 400 (K/uL)    BASIC METABOLIC PANEL Status: Abnormal    Collection Time    03/01/12 10:27 AM   Component  Value  Range  Comment    Sodium  141  135 - 145 (mEq/L)     Potassium  4.4  3.5 - 5.1 (mEq/L)     Chloride  105  96 - 112 (mEq/L)     CO2  28  19 - 32 (mEq/L)     Glucose, Bld  97  70 - 99 (mg/dL)     BUN  12  6 - 23 (mg/dL)     Creatinine, Ser  5.62  0.50 - 1.35 (mg/dL)     Calcium  9.4  8.4 - 10.5 (mg/dL)     GFR calc non Af Amer  60 (*)  >90 (mL/min)     GFR calc Af Amer  69 (*)  >90 (mL/min)     Diagnostics: Ct Abdomen Pelvis W Contrast  02/29/2012 *RADIOLOGY REPORT* Clinical Data: Abdominal pain, nausea and vomiting. CT ABDOMEN AND PELVIS WITH CONTRAST Technique: Multidetector CT imaging of the abdomen and pelvis was performed following the standard protocol during bolus administration of intravenous contrast. Contrast: OMNIPAQUE IOHEXOL 300 MG/ML SOLN Comparison: None. Findings: The liver, gallbladder, pancreas, spleen, adrenal glands and kidneys are unremarkable. Benign appearing inferior right renal cyst present. Bowel loops are normal caliber and show no evidence of obstruction or inflammation. No inflammatory process, free fluid or abscess is identified. The abdominal aorta is of normal caliber and shows mild atherosclerotic changes. The bladder is unremarkable.  No hernias. No bony lesions. Osteoarthritis of both hips. IMPRESSION: No acute findings in the abdomen or pelvis. Original Report Authenticated By: Reola Calkins, M.D.  Ct Maxillofacial W/cm  03/01/2012 *RADIOLOGY REPORT* Clinical Data: Lower lobe swelling, drainage. CT MAXILLOFACIAL WITH CONTRAST Technique: Multidetector CT imaging of the maxillofacial structures was performed with intravenous contrast. Multiplanar CT image reconstructions were also generated. Contrast: 75mL OMNIPAQUE IOHEXOL 300 MG/ML SOLN Comparison: None Findings: There is diffuse soft tissue edema/stranding within the lower left without well-defined fluid collection. Findings are suspicious for cellulitis. No acute bony abnormality. Old left medial orbital wall blowout fracture. Mild mucosal thickening throughout the paranasal sinuses. No air fluid levels. Orbital soft tissues are unremarkable. IMPRESSION: Soft tissue edema/stranding within the lower lip soft tissues without well-defined fluid collection. Question cellulitis. Mild chronic sinusitis. Original Report Authenticated By: Cyndie Chime, M.D.  Procedures: none  Full Code  Hospital Course:  See H&P for complete admission details. The patient is a pleasant 71 year old black male who had had several day history of lip swelling. He was seen in the emergency room the day prior to admission for the lip swelling and also had abdominal pain which has resolved. Apparently, he had a cold sore and self treated with over-the-counter topical therapy. The swelling initially started on the lower lip that spread to the upper lip and down his chin. He had no fevers chills. There was some purulent drainage. CT scan of the maxillofacial bones and soft tissues showed no abscess. 2 soft tissue swelling consistent with cellulitis. Patient was admitted overnight and started on clindamycin. Prior to admission he had been  on 2-3 doses of Bactrim. Today, his cellulitis is much improved and he has  been otherwise medically stable. He sees an urgent care physician in [place] occasionally and I've encouraged him to followup if symptoms worsen, or if he has any reaction to the antibiotic.  Discharge Exam:  Blood pressure 118/65, pulse 60, temperature 97.7 F (36.5 C), temperature source Oral, resp. rate 16, height 6\' 3"  (1.905 m), weight 98.657 kg (217 lb 8 oz), SpO2 94.00%.  Lips much less edematous, less red. Face less red. No abscess, fluctuance, drainage.  SignedChristiane Ha  03/02/2012, 8:59 AM

## 2012-03-02 NOTE — Progress Notes (Signed)
Patient was given discharge instructions along with follow up appointments and prescriptions. Patient verbalized understanding of all instructions. Patient was escorted by staff via wheelchair to vehicle. Patient discharged to home in stable condition. 

## 2012-07-27 DIAGNOSIS — R339 Retention of urine, unspecified: Secondary | ICD-10-CM | POA: Insufficient documentation

## 2012-07-27 DIAGNOSIS — Z87438 Personal history of other diseases of male genital organs: Secondary | ICD-10-CM | POA: Insufficient documentation

## 2012-07-27 DIAGNOSIS — N529 Male erectile dysfunction, unspecified: Secondary | ICD-10-CM | POA: Insufficient documentation

## 2012-07-27 DIAGNOSIS — R350 Frequency of micturition: Secondary | ICD-10-CM | POA: Insufficient documentation

## 2013-01-06 ENCOUNTER — Emergency Department (HOSPITAL_COMMUNITY): Payer: Medicare Other

## 2013-01-06 ENCOUNTER — Emergency Department (HOSPITAL_COMMUNITY)
Admission: EM | Admit: 2013-01-06 | Discharge: 2013-01-06 | Disposition: A | Discharge status: Home / Self Care | Payer: Medicare Other | Attending: Emergency Medicine | Admitting: Emergency Medicine

## 2013-01-06 ENCOUNTER — Encounter (HOSPITAL_COMMUNITY): Payer: Self-pay | Admitting: *Deleted

## 2013-01-06 DIAGNOSIS — M47812 Spondylosis without myelopathy or radiculopathy, cervical region: Secondary | ICD-10-CM | POA: Insufficient documentation

## 2013-01-06 DIAGNOSIS — H9209 Otalgia, unspecified ear: Secondary | ICD-10-CM | POA: Insufficient documentation

## 2013-01-06 DIAGNOSIS — F17200 Nicotine dependence, unspecified, uncomplicated: Secondary | ICD-10-CM | POA: Insufficient documentation

## 2013-01-06 DIAGNOSIS — Z79899 Other long term (current) drug therapy: Secondary | ICD-10-CM | POA: Insufficient documentation

## 2013-01-06 DIAGNOSIS — Z7982 Long term (current) use of aspirin: Secondary | ICD-10-CM | POA: Insufficient documentation

## 2013-01-06 DIAGNOSIS — Z8679 Personal history of other diseases of the circulatory system: Secondary | ICD-10-CM | POA: Insufficient documentation

## 2013-01-06 DIAGNOSIS — R51 Headache: Secondary | ICD-10-CM | POA: Insufficient documentation

## 2013-01-06 DIAGNOSIS — I10 Essential (primary) hypertension: Secondary | ICD-10-CM | POA: Insufficient documentation

## 2013-01-06 DIAGNOSIS — G8929 Other chronic pain: Secondary | ICD-10-CM | POA: Insufficient documentation

## 2013-01-06 HISTORY — DX: Essential (primary) hypertension: I10

## 2013-01-06 NOTE — ED Provider Notes (Signed)
History     CSN: [id number]  Arrival date & time 01/06/13  1604   First MD Initiated Contact with Patient 01/06/13 1709      No chief complaint on file.   (Consider location/radiation/quality/duration/timing/severity/associated sxs/prior treatment) HPI Comments: Patient is a 72 year old man who says food there is a family history of cerebral aneurysm. He has had pain in the right side of his neck and into his right temporal region for about a month. There's been no history of injury. He has a history of chronic back pain, treated by a physician in [place] with Vicodin ES. There is no prior history of aneurysm in this patient. He does have a history of prior hypertension, recently his blood pressure is been low and  he has been taken off of antihypertensive medication.  Patient is a 72 y.o. male presenting with headaches. The history is provided by the patient and medical records. No language interpreter was used.  Headache Pain location:  R temporal Radiates to: Patient says the pain originates from the right side of his neck and goes up into his right temple. Severity currently:  8/10 Severity at highest:  8/10 Onset quality:  Gradual Duration: Intermittent episodes over about a month. Timing:  Intermittent Progression:  Unchanged Chronicity:  Recurrent Similar to prior headaches: no   Relieved by: He takes Vicodin ES for chronic pain. Worsened by:  Nothing tried Associated symptoms: ear pain (he has had some pain in the right ear.) and neck pain   Associated symptoms: no fever     Past Medical History  Diagnosis Date  . Back pain   . Chest pain   . Hypertension     Past Surgical History  Procedure Laterality Date  . Hernia repair    . Transurethral resection of prostate      Family History  Problem Relation Age of Onset  . Cancer Father     History  Substance Use Topics  . Smoking status: Current Every Day Smoker    Types: Cigarettes  . Smokeless tobacco: Not  on file  . Alcohol Use: No      Review of Systems  Constitutional: Negative.  Negative for fever and chills.  HENT: Positive for ear pain (he has had some pain in the right ear.) and neck pain.   Eyes: Negative.   Respiratory: Negative.   Cardiovascular: Negative.   Gastrointestinal: Negative.   Genitourinary: Negative.   Skin: Negative.   Neurological: Positive for headaches.  Psychiatric/Behavioral: Negative.     Allergies  Review of patient's allergies indicates no known allergies.  Home Medications   Current Outpatient Rx  Name  Route  Sig  Dispense  Refill  . acetaminophen (TYLENOL) 325 MG tablet   Oral   Take 2 tablets (650 mg total) by mouth every 6 (six) hours as needed (or Fever >/= 101).         Marland Kitchen aspirin EC 81 MG tablet   Oral   Take 81 mg by mouth every morning.         . diazepam (VALIUM) 5 MG tablet   Oral   Take 5 mg by mouth at bedtime.         . fish oil-omega-3 fatty acids 1000 MG capsule   Oral   Take 1 capsule by mouth every morning.          Marland Kitchen HYDROcodone-acetaminophen (VICODIN ES) 7.5-750 MG per tablet   Oral   Take 1 tablet by mouth 2 (two)  times daily as needed. For pain         . Iron-Vitamins (GERITOL COMPLETE PO)   Oral   Take 1 tablet by mouth every morning.         Marland Kitchen EXPIRED: promethazine (PHENERGAN) 25 MG tablet   Oral   Take 1 tablet (25 mg total) by mouth every 6 (six) hours as needed for nausea.   15 tablet   0   . triamterene-hydrochlorothiazide (MAXZIDE-25) 37.5-25 MG per tablet   Oral   Take 0.5 tablets by mouth daily as needed. For high blood pressure         . VITAMIN E PO   Oral   Take 1 tablet by mouth every morning.           BP 107/72  Pulse 86  Temp(Src) 97.4 F (36.3 C) (Oral)  Resp 20  Ht 6\' 3"  (1.905 m)  Wt 220 lb (99.791 kg)  BMI 27.5 kg/m2  SpO2 97%  Physical Exam  Nursing note and vitals reviewed. Constitutional: He is oriented to person, place, and time. He appears  well-developed and well-nourished. No distress.  HENT:  Head: Normocephalic and atraumatic.  Right Ear: External ear normal.  Left Ear: External ear normal.  Mouth/Throat: Oropharynx is clear and moist.  Eyes: Conjunctivae and EOM are normal. Pupils are equal, round, and reactive to light.  Neck: Normal range of motion. Neck supple.  Cardiovascular: Normal rate, regular rhythm and normal heart sounds.   Pulmonary/Chest: Effort normal and breath sounds normal.  Abdominal: Soft. Bowel sounds are normal.  Musculoskeletal: Normal range of motion. He exhibits no edema and no tenderness.  Neurological: He is alert and oriented to person, place, and time.  No sensory or motor deficit.  Skin: Skin is warm and dry.  Psychiatric: He has a normal mood and affect. His behavior is normal.    ED Course  Procedures (including critical care time)  5:30 PM Patient was seen and had physical examination. CT of the head and cervical spine were ordered.  7:31 PM  Ct Head Wo Contrast  01/06/2013  *RADIOLOGY REPORT*  Clinical Data:  Right-sided headache, history cervical arthritis, trauma,  CT HEAD WITHOUT CONTRAST CT CERVICAL SPINE WITHOUT CONTRAST  Technique:  Multidetector CT imaging of the head and cervical spine was performed following the standard protocol without intravenous contrast.  Multiplanar CT image reconstructions of the cervical spine were also generated.  Comparison:  Face CT of the head CT 06/12/2004  CT HEAD  Findings: No acute intracranial hemorrhage.  No focal mass lesion. No CT evidence of acute infarction.   No midline shift or mass effect.  No hydrocephalus.  Basilar cisterns are patent.  Paranasal sinuses and mastoid air cells are clear.  Orbits are normal.  IMPRESSION: No acute intracranial findings.  No change from prior  CT CERVICAL SPINE  Findings: No prevertebral soft tissue swelling.  Normal alignment of cervical vertebral bodies.  No loss of vertebral body height. Normal facet  articulation.  Normal craniocervical junction.  No evidence epidural or paraspinal hematoma.  There are multiple levels of endplate spurring and disc space narrowing.  There is neural foraminal narrowing on the left and right.  IMPRESSION:  1.  No cervical spine fracture. 2.  Multilevel disc osteophytic disease.   Original Report Authenticated By: Genevive Bi, M.D.    Ct Cervical Spine Wo Contrast  01/06/2013  *RADIOLOGY REPORT*  Clinical Data:  Right-sided headache, history cervical arthritis, trauma,  CT HEAD WITHOUT  CONTRAST CT CERVICAL SPINE WITHOUT CONTRAST  Technique:  Multidetector CT imaging of the head and cervical spine was performed following the standard protocol without intravenous contrast.  Multiplanar CT image reconstructions of the cervical spine were also generated.  Comparison:  Face CT of the head CT 06/12/2004  CT HEAD  Findings: No acute intracranial hemorrhage.  No focal mass lesion. No CT evidence of acute infarction.   No midline shift or mass effect.  No hydrocephalus.  Basilar cisterns are patent.  Paranasal sinuses and mastoid air cells are clear.  Orbits are normal.  IMPRESSION: No acute intracranial findings.  No change from prior  CT CERVICAL SPINE  Findings: No prevertebral soft tissue swelling.  Normal alignment of cervical vertebral bodies.  No loss of vertebral body height. Normal facet articulation.  Normal craniocervical junction.  No evidence epidural or paraspinal hematoma.  There are multiple levels of endplate spurring and disc space narrowing.  There is neural foraminal narrowing on the left and right.  IMPRESSION:  1.  No cervical spine fracture. 2.  Multilevel disc osteophytic disease.   Original Report Authenticated By: Genevive Bi, M.D.    7:31 PM CT x-rays showed DJD of C-spine, no evidence of aneurysm.  Reassured and released.  1. Degenerative arthritis of cervical spine           Carleene Cooper III, MD 01/06/13 [phone]

## 2013-01-06 NOTE — ED Notes (Signed)
Pt alert & oriented x4, stable gait. Patient given discharge instructions, paperwork & prescription(s). Patient  instructed to stop at the registration desk to finish any additional paperwork. Patient verbalized understanding. Pt left department w/ no further questions. 

## 2013-01-06 NOTE — ED Notes (Signed)
Pain rt ear and rt side of head x 1 month,  Ringing in rt ear also.  No injury  " eyes and nose running," sore throat.

## 2013-01-06 NOTE — ED Notes (Signed)
EDP At bedside. See assessment.

## 2014-02-12 ENCOUNTER — Emergency Department (HOSPITAL_COMMUNITY)
Admission: EM | Admit: 2014-02-12 | Discharge: 2014-02-12 | Disposition: A | Discharge status: Home / Self Care | Payer: Medicare Other | Attending: Emergency Medicine | Admitting: Emergency Medicine

## 2014-02-12 ENCOUNTER — Emergency Department (HOSPITAL_COMMUNITY): Payer: Medicare Other

## 2014-02-12 ENCOUNTER — Encounter (HOSPITAL_COMMUNITY): Payer: Self-pay | Admitting: Emergency Medicine

## 2014-02-12 DIAGNOSIS — Z7982 Long term (current) use of aspirin: Secondary | ICD-10-CM | POA: Insufficient documentation

## 2014-02-12 DIAGNOSIS — M25469 Effusion, unspecified knee: Secondary | ICD-10-CM | POA: Insufficient documentation

## 2014-02-12 DIAGNOSIS — J029 Acute pharyngitis, unspecified: Secondary | ICD-10-CM | POA: Insufficient documentation

## 2014-02-12 DIAGNOSIS — J329 Chronic sinusitis, unspecified: Secondary | ICD-10-CM | POA: Insufficient documentation

## 2014-02-12 DIAGNOSIS — I10 Essential (primary) hypertension: Secondary | ICD-10-CM | POA: Insufficient documentation

## 2014-02-12 DIAGNOSIS — F17200 Nicotine dependence, unspecified, uncomplicated: Secondary | ICD-10-CM | POA: Insufficient documentation

## 2014-02-12 LAB — CBC WITH DIFFERENTIAL/PLATELET
BASOS ABS: 0 10*3/uL (ref 0.0–0.1)
BASOS PCT: 0 % (ref 0–1)
EOS PCT: 8 % — AB (ref 0–5)
Eosinophils Absolute: 0.4 10*3/uL (ref 0.0–0.7)
HEMATOCRIT: 38.1 % — AB (ref 39.0–52.0)
Hemoglobin: 13.4 g/dL (ref 13.0–17.0)
Lymphocytes Relative: 49 % — ABNORMAL HIGH (ref 12–46)
Lymphs Abs: 2.2 10*3/uL (ref 0.7–4.0)
MCH: 32.2 pg (ref 26.0–34.0)
MCHC: 35.2 g/dL (ref 30.0–36.0)
MCV: 91.6 fL (ref 78.0–100.0)
MONO ABS: 0.5 10*3/uL (ref 0.1–1.0)
Monocytes Relative: 11 % (ref 3–12)
NEUTROS ABS: 1.5 10*3/uL — AB (ref 1.7–7.7)
Neutrophils Relative %: 32 % — ABNORMAL LOW (ref 43–77)
PLATELETS: 120 10*3/uL — AB (ref 150–400)
RBC: 4.16 MIL/uL — ABNORMAL LOW (ref 4.22–5.81)
RDW: 13.1 % (ref 11.5–15.5)
WBC: 4.6 10*3/uL (ref 4.0–10.5)

## 2014-02-12 LAB — COMPREHENSIVE METABOLIC PANEL
ALBUMIN: 3.8 g/dL (ref 3.5–5.2)
ALT: 14 U/L (ref 0–53)
AST: 17 U/L (ref 0–37)
Alkaline Phosphatase: 56 U/L (ref 39–117)
BUN: 20 mg/dL (ref 6–23)
CALCIUM: 9 mg/dL (ref 8.4–10.5)
CO2: 27 mEq/L (ref 19–32)
CREATININE: 1.06 mg/dL (ref 0.50–1.35)
Chloride: 102 mEq/L (ref 96–112)
GFR calc Af Amer: 78 mL/min — ABNORMAL LOW (ref >90)
GFR calc non Af Amer: 67 mL/min — ABNORMAL LOW (ref >90)
Glucose, Bld: 98 mg/dL (ref 70–99)
Potassium: 4 mEq/L (ref 3.7–5.3)
SODIUM: 139 meq/L (ref 137–147)
Total Bilirubin: 0.3 mg/dL (ref 0.3–1.2)
Total Protein: 7 g/dL (ref 6.0–8.3)

## 2014-02-12 MED ORDER — CELECOXIB 100 MG PO CAPS: 100.0000 mg | ORAL_CAPSULE | Freq: Two times a day (BID) | ORAL | Status: DC

## 2014-02-12 MED ORDER — AMOXICILLIN 500 MG PO CAPS: 500.0000 mg | ORAL_CAPSULE | Freq: Three times a day (TID) | ORAL | Status: DC

## 2014-02-12 NOTE — Discharge Instructions (Signed)
Follow up with your md next week. °

## 2014-02-12 NOTE — ED Notes (Addendum)
Dr. Estell HarpinZammit at bedside at this time. Per Dr. Estell HarpinZammit, pt can have some food.

## 2014-02-12 NOTE — ED Notes (Addendum)
Multiple complaints. Pt reports headache,sore throat and right knee pain x3 weeks. Pt alert and oriented. nad noted. Pt denies any dizziness,changes in vision. Pt reports pain when swallowing as well as right knee pain with ambulation. Pt ambulated to triage with steady gait. Pt denies any known injury to right knee. Mild swelling noted to right knee. Airway patent.

## 2014-02-12 NOTE — ED Provider Notes (Signed)
CSN: [id number]     Arrival date & time 02/12/14  1534 History  This chart was scribed for Dennis LennertJoseph L Abel Hageman, MD by Dennis Ruiz, ED Scribe. This patient was seen in room [facility]14[facility] and the patient's care was started at 4:06 PM.      Chief Complaint  Patient presents with  . Sore Throat   Patient is a 73 y.o. male presenting with pharyngitis. The history is provided by the patient. No language interpreter was used.  Sore Throat This is a new problem. The current episode started more than 1 week ago. The problem occurs rarely. The problem has not changed since onset.Nothing aggravates the symptoms. Nothing relieves the symptoms. Treatments tried: OTC medications. The treatment provided no relief.   HPI Comments: Dennis Ruiz is a 73 y.o. male who presents to the Emergency Department complaining of a constant sore throat onset 3 weeks ago. Pt complains of associated symptoms of HA, congestion, and right leg swelling. He also states that all of his symptoms have been present for the past 3 weeks.  Pt states he has been taking OTC medications for HA with no relief.   Past Medical History  Diagnosis Date  . Back pain   . Chest pain   . Hypertension    Past Surgical History  Procedure Laterality Date  . Hernia repair    . Transurethral resection of prostate     Family History  Problem Relation Age of Onset  . Cancer Father    History  Substance Use Topics  . Smoking status: Current Every Day Smoker -- 1.00 packs/day    Types: Cigarettes  . Smokeless tobacco: Not on file     Comment: pt reprots pack every two weeks.  . Alcohol Use: No     Comment: socail    Review of Systems  Constitutional: Negative for appetite change and fatigue.  HENT: Positive for congestion and sore throat. Negative for ear discharge and sinus pressure.   Eyes: Negative for discharge.  Respiratory: Positive for cough.   Gastrointestinal: Negative for diarrhea.  Genitourinary: Negative for frequency and  hematuria.  Musculoskeletal: Positive for joint swelling (Right knee).  Skin: Negative for rash.  Neurological: Negative for seizures.  Psychiatric/Behavioral: Negative for hallucinations.      Allergies  Review of patient's allergies indicates no known allergies.  Home Medications   Prior to Admission medications   Medication Sig Start Date End Date Taking? Authorizing Provider  aspirin EC 81 MG tablet Take 81 mg by mouth every morning.    Historical Provider, MD  HYDROcodone-acetaminophen (NORCO) 7.5-325 MG per tablet Take 1 tablet by mouth every 6 (six) hours as needed for pain.    Historical Provider, MD  Iron-Vitamins (GERITOL COMPLETE PO) Take 1 tablet by mouth every morning.    Historical Provider, MD  promethazine (PHENERGAN) 25 MG tablet Take 1 tablet (25 mg total) by mouth every 6 (six) hours as needed for nausea. 02/29/12 03/07/12  Dennis LennertJoseph L Dawson Hollman, MD  triamterene-hydrochlorothiazide (MAXZIDE-25) 37.5-25 MG per tablet Take 0.5 tablets by mouth daily as needed. For high blood pressure    Historical Provider, MD  VITAMIN E PO Take 1 tablet by mouth every morning.    Historical Provider, MD   Triage Vitals: BP 154/79  Pulse 75  Temp(Src) 98 F (36.7 C) (Oral)  Resp 14  Ht 6\' 3"  (1.905 m)  Wt 190 lb (86.183 kg)  BMI 23.75 kg/m2  SpO2 98%  Physical Exam  Nursing note and  vitals reviewed. Constitutional: He is oriented to person, place, and time. He appears well-developed.  HENT:  Head: Normocephalic.  Eyes: Conjunctivae and EOM are normal. No scleral icterus.  Neck: Neck supple. No thyromegaly present.  Cardiovascular: Normal rate and regular rhythm.  Exam reveals no gallop and no friction rub.   No murmur heard. Pulmonary/Chest: No stridor. He has no wheezes. He has no rales. He exhibits no tenderness.  Abdominal: He exhibits no distension. There is no tenderness. There is no rebound.  Musculoskeletal: Normal range of motion.  Minimal swelling to right knee   Lymphadenopathy:    He has no cervical adenopathy.  Neurological: He is oriented to person, place, and time. He exhibits normal muscle tone. Coordination normal.  Skin: No rash noted. No erythema.  Psychiatric: He has a normal mood and affect. His behavior is normal.    ED Course  Procedures (including critical care time) DIAGNOSTIC STUDIES: Oxygen Saturation is 98% on RA, Normal by my interpretation.    COORDINATION OF CARE: 4:13 PM-Discussed treatment plan which includes right knee X-Ray, CBC panel, and CMP with pt at bedside and pt agreed to plan.   Labs Review Labs Reviewed  CBC WITH DIFFERENTIAL - Abnormal; Notable for the following:    RBC 4.16 (*)    HCT 38.1 (*)    Platelets 120 (*)    Neutrophils Relative % 32 (*)    Neutro Abs 1.5 (*)    Lymphocytes Relative 49 (*)    Eosinophils Relative 8 (*)    All other components within normal limits  COMPREHENSIVE METABOLIC PANEL - Abnormal; Notable for the following:    GFR calc non Af Amer 67 (*)    GFR calc Af Amer 78 (*)    All other components within normal limits    Imaging Review Dg Knee Complete 4 Views Right  02/12/2014   CLINICAL DATA:  Right knee pain with weight-bearing.  EXAM: RIGHT KNEE - COMPLETE 4+ VIEW  COMPARISON:  Plain films right knee 04/22/2005.  FINDINGS: Moderate joint effusion is identified. There is no fracture. Mild appearing degenerative change about the knee is most notable in the patellofemoral compartment.  IMPRESSION: Negative for fracture.  Moderate joint effusion.  Overall mild appearing degenerative change most notable in the patellofemoral compartment.   Electronically Signed   By: Drusilla Kannerhomas  Dalessio M.D.   On: 02/12/2014 17:00     EKG Interpretation None      MDM   Final diagnoses:  None   The chart was scribed for me under my direct supervision.  I personally performed the history, physical, and medical decision making and all procedures in the evaluation of this  patient.Dennis Ruiz.      Dennis Nakama L Synda Bagent, MD 02/12/14 [phone]

## 2014-04-26 ENCOUNTER — Encounter (HOSPITAL_COMMUNITY): Payer: Self-pay | Admitting: Emergency Medicine

## 2014-04-26 ENCOUNTER — Emergency Department (HOSPITAL_COMMUNITY)
Admission: EM | Admit: 2014-04-26 | Discharge: 2014-04-26 | Disposition: A | Discharge status: Home / Self Care | Payer: Medicare Other | Attending: Emergency Medicine | Admitting: Emergency Medicine

## 2014-04-26 DIAGNOSIS — I10 Essential (primary) hypertension: Secondary | ICD-10-CM | POA: Insufficient documentation

## 2014-04-26 DIAGNOSIS — Z79899 Other long term (current) drug therapy: Secondary | ICD-10-CM | POA: Insufficient documentation

## 2014-04-26 DIAGNOSIS — Y929 Unspecified place or not applicable: Secondary | ICD-10-CM | POA: Insufficient documentation

## 2014-04-26 DIAGNOSIS — T6391XA Toxic effect of contact with unspecified venomous animal, accidental (unintentional), initial encounter: Secondary | ICD-10-CM | POA: Insufficient documentation

## 2014-04-26 DIAGNOSIS — F17200 Nicotine dependence, unspecified, uncomplicated: Secondary | ICD-10-CM | POA: Insufficient documentation

## 2014-04-26 DIAGNOSIS — Z7982 Long term (current) use of aspirin: Secondary | ICD-10-CM | POA: Insufficient documentation

## 2014-04-26 DIAGNOSIS — T63461A Toxic effect of venom of wasps, accidental (unintentional), initial encounter: Secondary | ICD-10-CM | POA: Insufficient documentation

## 2014-04-26 DIAGNOSIS — T63481A Toxic effect of venom of other arthropod, accidental (unintentional), initial encounter: Secondary | ICD-10-CM

## 2014-04-26 DIAGNOSIS — Y939 Activity, unspecified: Secondary | ICD-10-CM | POA: Insufficient documentation

## 2014-04-26 MED ORDER — FAMOTIDINE IN NACL 20-0.9 MG/50ML-% IV SOLN
20.0000 mg | Freq: Once | INTRAVENOUS | Status: AC
  Administered 2014-04-26: 20 mg via INTRAVENOUS
  Filled 2014-04-26: qty 50

## 2014-04-26 MED ORDER — METHYLPREDNISOLONE SODIUM SUCC 125 MG IJ SOLR
125.0000 mg | Freq: Once | INTRAMUSCULAR | Status: AC
  Administered 2014-04-26: 125 mg via INTRAVENOUS
  Filled 2014-04-26: qty 2

## 2014-04-26 MED ORDER — PREDNISONE 20 MG PO TABS: 40.0000 mg | ORAL_TABLET | Freq: Every day | ORAL | Status: DC

## 2014-04-26 MED ORDER — DIPHENHYDRAMINE HCL 25 MG PO CAPS
25.0000 mg | ORAL_CAPSULE | Freq: Once | ORAL | Status: AC
  Administered 2014-04-26: 25 mg via ORAL
  Filled 2014-04-26: qty 1

## 2014-04-26 NOTE — ED Notes (Signed)
Placed on cardiac monitor and IV sited Lt AC at this time

## 2014-04-26 NOTE — ED Notes (Signed)
Pt reports was stung on r ear and r cheek approx 1 hour ago.  Reports throat swelled the last time he was stung.  Pt says feels like throat is swelling again.

## 2014-04-26 NOTE — ED Provider Notes (Signed)
CSN: [id number]     Arrival date & time 04/26/14  1139 History   First MD Initiated Contact with Patient 04/26/14 1153     Chief Complaint  Patient presents with  . Allergic Reaction      HPI Pt was seen at 1155. Per pt, c/o sudden onset and resolution of 2 episodes of "bee sting" that occurred approximately 1 hour PTA. Pt states he was "stung" on his right ear and right face. Did not take any meds PTA. Pt states previously he had a bee sting "that made my throat swell." Denies hx of anaphylaxis, denies hx of having epi pen. Denies syncope, no SOB/wheezing, no intra-oral edema, no dysphagia, no generalized rash.    Past Medical History  Diagnosis Date  . Back pain   . Chest pain   . Hypertension    Past Surgical History  Procedure Laterality Date  . Hernia repair    . Transurethral resection of prostate     Family History  Problem Relation Age of Onset  . Cancer Father    History  Substance Use Topics  . Smoking status: Current Every Day Smoker -- 1.00 packs/day    Types: Cigarettes  . Smokeless tobacco: Not on file     Comment: pt reprots pack every two weeks.  . Alcohol Use: No     Comment: socail    Review of Systems ROS: Statement: All systems negative except as marked or noted in the HPI; Constitutional: Negative for fever and chills. ; ; Eyes: Negative for eye pain, redness and discharge. ; ; ENMT: Negative for ear pain, hoarseness, nasal congestion, sinus pressure and sore throat. ; ; Cardiovascular: Negative for chest pain, palpitations, diaphoresis, dyspnea and peripheral edema. ; ; Respiratory: Negative for cough, wheezing and stridor. ; ; Gastrointestinal: Negative for nausea, vomiting, diarrhea, abdominal pain, blood in stool, hematemesis, jaundice and rectal bleeding. . ; ; Genitourinary: Negative for dysuria, flank pain and hematuria. ; ; Musculoskeletal: Negative for back pain and neck pain. Negative for deformity and trauma.; ; Skin: +insect sting. Negative for  pruritus, abrasions, blisters, bruising and skin lesion.; ; Neuro: Negative for headache, lightheadedness and neck stiffness. Negative for weakness, altered level of consciousness , altered mental status, extremity weakness, paresthesias, involuntary movement, seizure and syncope.      Allergies  Review of patient's allergies indicates no known allergies.  Home Medications   Prior to Admission medications   Medication Sig Start Date End Date Taking? Authorizing Provider  aspirin EC 81 MG tablet Take 81 mg by mouth every morning.   Yes Historical Provider, MD  Cholecalciferol (VITAMIN D PO) Take 2 tablets by mouth daily.   Yes Historical Provider, MD  Iron-Vitamins (GERITOL COMPLETE PO) Take 1 tablet by mouth every morning.   Yes Historical Provider, MD  Multiple Vitamins-Minerals (HAIR/SKIN/NAILS PO) Take 1 tablet by mouth daily.   Yes Historical Provider, MD  Multiple Vitamins-Minerals (ICAPS PO) Take 1 capsule by mouth daily.   Yes Historical Provider, MD  triamterene-hydrochlorothiazide (MAXZIDE-25) 37.5-25 MG per tablet Take 0.5 tablets by mouth daily as needed (When pressure is high). For high blood pressure   Yes Historical Provider, MD  VITAMIN E PO Take 1 tablet by mouth every morning.   Yes Historical Provider, MD   BP 115/81  Pulse 59  Temp(Src) 97.8 F (36.6 C) (Oral)  Resp 18  Ht 6\' 3"  (1.905 m)  Wt 200 lb (90.719 kg)  BMI 25.00 kg/m2  SpO2 99% Physical Exam  1200: Physical examination:  Nursing notes reviewed; Vital signs and O2 SAT reviewed;  Constitutional: Well developed, Well nourished, Well hydrated, In no acute distress; Head:  Normocephalic, atraumatic. +right anterior-lateral jaw line with localized edema and erythema.; Eyes: EOMI, PERRL, No scleral icterus; ENMT: Mouth and pharynx normal, Mucous membranes moist. Mouth and pharynx without lesions. No tonsillar exudates. No intra-oral edema. No submandibular or sublingual edema. No hoarse voice, no drooling, no  stridor. No trismus. ; Neck: Supple, Full range of motion, No lymphadenopathy; Cardiovascular: Regular rate and rhythm, No gallop; Respiratory: Breath sounds clear & equal bilaterally, No rales, rhonchi, wheezes.  Speaking full sentences with ease, Normal respiratory effort/excursion; Chest: Nontender, Movement normal; Abdomen: Soft, Nontender, Nondistended, Normal bowel sounds; Genitourinary: No CVA tenderness; Extremities: Pulses normal, No tenderness, No edema, No calf edema or asymmetry.; Neuro: AA&Ox3, Major CN grossly intact.  Speech clear. No gross focal motor or sensory deficits in extremities.; Skin: Color normal, Warm, Dry. No diffuse rash.   ED Course  Procedures     EKG Interpretation None       MDM  MDM Reviewed: previous chart, nursing note and vitals     1605:  Pt states he "feels good" and wants to go home now after several hours ED observation.  Right lower jaw line with significantly decreased edema and no further erythema. No intra-oral edema, lungs continue CTA bilat, Sats 98-99% R/A. Dx d/w pt and family.  Questions answered.  Verb understanding, agreeable to d/c home with outpt f/u.   Laray AngerKathleen M Neytiri Asche, DO 04/29/14 1350

## 2014-04-26 NOTE — Discharge Instructions (Signed)
°Emergency Department Resource Guide °1) Find a Doctor and Pay Out of Pocket °Although you won't have to find out who is covered by your insurance plan, it is a good idea to ask around and get recommendations. You will then need to call the office and see if the doctor you have chosen will accept you as a new patient and what types of options they offer for patients who are self-pay. Some doctors offer discounts or will set up payment plans for their patients who do not have insurance, but you will need to ask so you aren't surprised when you get to your appointment. ° °2) Contact Your Local Health Department °Not all health departments have doctors that can see patients for sick visits, but many do, so it is worth a call to see if yours does. If you don't know where your local health department is, you can check in your phone book. The CDC also has a tool to help you locate your state's health department, and many state websites also have listings of all of their local health departments. ° °3) Find a Walk-in Clinic °If your illness is not likely to be very severe or complicated, you may want to try a walk in clinic. These are popping up all over the country in pharmacies, drugstores, and shopping centers. They're usually staffed by nurse practitioners or physician assistants that have been trained to treat common illnesses and complaints. They're usually fairly quick and inexpensive. However, if you have serious medical issues or chronic medical problems, these are probably not your best option. ° °No Primary Care Doctor: °- Call [facility] at  832-8000 - they can help you locate a primary care doctor that  accepts your insurance, provides certain services, etc. °- Physician Referral Service- 1-800-533-3463 ° °Chronic Pain Problems: °Organization         Address  Phone   Notes  °[facility]  ([phone]) [phone] Patients need to be referred by their primary care doctor.  ° °Medication  Assistance: °Organization         Address  Phone   Notes  °[place] [facility] [address] °[place], [place] [address] (336) 641-8030 --Must be a resident of [place] °-- Must have NO insurance coverage whatsoever (no Medicaid/ Medicare, etc.) °-- The pt. MUST have a primary care doctor that directs their care regularly and follows them in the community °  °[facility]  (866) 331-1348   °[employer]  (888) 892-1162   ° °Agencies that provide inexpensive medical care: °Organization         Address  Phone   Notes  °[facility]  (336) 832-8035   °[facility]    (336) 832-7272   °[facility] [address] °[place], [place] [address] (336) 832-4777   °[facility] [address], °[place] (336) 271-4999   °[facility]    (336) 373-0678   °[facility]    (336) 272-1050   °[facility] ° [address], [place] Phone:  (336) 832-4444, Fax:  (336) 832-4440 Hours of Operation:  9 am - 6 pm, M-F.  Also accepts Medicaid/Medicare and self-pay.  °[facility] ° [address], [place] Phone: (336) 832-3150, Fax: (336) [phone]. Hours of Operation:  8:30 am - 5:30 pm, M-F.  Also accepts Medicaid and self-pay.  °[facility] [place] [address], [place] Phone: (336) 878-6027   °[facility] [address], [place], [place] (336)723-1848, Ext. 123 Mondays & Thursdays: 7-9 AM.  First 15 patients are seen on a first come, first serve basis. °  ° °Medicaid-accepting [place] Providers: ° °Organization         Address  Phone   Notes  °[facility] [address], [place] (336) 641-2100 Also accepts self-pay patients.  °[facility] [address], [place] ° (336) 856-9996   °[facility] [address], [place]  (336) 288-8857   °[facility] [address] [place] [address], [place] (336) 299-7000   °Veita Bland [address], [place]  ° (336) 373-1557 Only accepts [place] Access Medicaid patients after they have their name applied to their card.  ° °Self-Pay (no insurance) in [place]: ° °Organization         Address  Phone   Notes  °Sickle Cell Patients, [facility] [address], [place] (336) 832-1970   °[facility] [address], [place] (336) 832-4400   °[facility] ° [address], [place] (336) 992-4800   °[facility]/Dr. Osei-Bonsu ° [address] [place] [address], [place] or [address], [place] (336) 841-8500 Phone number for both [place] and [place] locations is the same.  °[facility] [address], [place] (336) 299-0000   °[facility] [address] [place] [address], [place] or [address] (336) 852-7530 °(336) 878-2260   °[facility] [address], [place] (336) 350-1642, phone; (336) 294-5005, fax Sees patients 1st and 3rd Saturday of every month.  Must not qualify for public or private insurance (i.e. Medicaid, Medicare, [place] Health Choice, Veterans' Benefits) • Household income should be no more than 200% of the poverty level •The clinic cannot treat you if you are pregnant or think you are pregnant • Sexually transmitted diseases are not treated at the clinic.  ° ° °Dental Care: °Organization         Address  Phone  Notes  °[place] [facility] [address], [place] (336) 641-6152 Accepts children up to age 21 who are enrolled in Medicaid or [place] Health Choice; pregnant women with a Medicaid card; and children who have applied for Medicaid or [place] Health Choice, but were declined, whose parents can pay a reduced fee at time of service.  °[place]  [facility] [place]  [address], [place] (336) 641-7733 Accepts children up to age 21 who are enrolled in Medicaid or [place] Health Choice; pregnant women with a Medicaid card; and children who have applied for Medicaid or [place] Health Choice, but were declined, whose parents can pay a reduced fee at time of service.  °[facility] ° [address], [place] (336) 641-4533 Patients are seen by appointment only. Walk-ins are not accepted. [facility] will see patients 18 years of age and older. °Monday - Tuesday (8am-5pm) °Most Wednesdays (8:30-5pm) °$30 per visit, cash only  °[facility] ° [address], [place] (336) 641-4533 Patients are seen by appointment only. Walk-ins are not accepted. [facility] will see patients 18 years of age and older. °One   Wednesday Evening (Monthly: Volunteer Based).  $30 per visit, cash only  °[facility]  (919) 537-3737 for adults; Children under age 4, call Graduate Pediatric Dentistry at ([phone]) [phone]. Children aged 4-14, please call ([phone]) [phone] to request a pediatric application. ° Dental services are provided in all areas of dental care including fillings, crowns and bridges, complete and partial dentures, implants, gum treatment, root canals, and extractions. Preventive care is also provided. Treatment is provided to both adults and children. °Patients are selected via a lottery and there is often a waiting list. °  °[facility] [address], °[place] ° (336) 763-8833 [email or web address] °  °[facility] [address], [place], [place] (336)723-1848, Ext. 123 Second and Fourth Thursday of each month, opens at 6:30 AM; Clinic ends at 9 AM.  Patients are seen on a first-come first-served basis, and a limited number are seen during each clinic.  ° °[facility] ° [address], [place], [place] (336) 723-7904    Eligibility Requirements °You must have lived in [place], [place], or [place] counties for at least the last three months. °  You cannot be eligible for state or federal sponsored healthcare insurance, including Veterans Administration, Medicaid, or Medicare. °  You generally cannot be eligible for healthcare insurance through your employer.  °  How to apply: °Eligibility screenings are held every Tuesday and Wednesday afternoon from 1:00 pm until 4:00 pm. You do not need an appointment for the interview!  °[facility] [address], [place], [place] 336-631-2330   °[facility]  336-342-8273   °[facility]  336-703-3100   °[facility]  336-570-6415   ° °Behavioral Health Resources in the Community: °Intensive Outpatient Programs °Organization         Address  Phone  Notes  °[facility] [address], [place], [place] 336-878-6098   °[facility] [address], [place], [place] 336-832-9800   °[facility] [address], [place], [place] ° 336-882-2125   °[facility] [address],  °[place], [place] 1-800-853-5163 or 336-641-4981   °Substance Abuse Resources °Organization         Address  Phone  Notes  °[facility]  336-882-2125   °[facility]  336-784-9470   °[facility]  336-285-9073   °[facility]  336-845-3988   °Residential & Outpatient Substance Abuse Program  1-800-659-3381   °Psychological Services °Organization         Address  Phone  Notes  °[facility]  336- 832-9600   °[facility]  336- 378-7881   °[facility] [address], [place] 1-800-853-5163 or 336-641-4981   ° °Mobile Crisis Teams °Organization         Address  Phone  Notes  °[facility], Mobile Crisis Care Unit  1-877-626-1772   °Assertive °[facility] ° [address]  [place], [place] 336-834-9664   °Sharon DeEsch [address] °[place] 336-554-5454   ° °Self-Help/Support Groups °Organization         Address  Phone             Notes  °[facility] of  - variety of support groups  336- 373-1402 Call for more information  °Narcotics Anonymous (NA), [facility] [address], °[place]  2 meetings at this location  ° °  Residential Treatment Programs Organization         Address  Phone  Notes  [facility] [address],    [place]  [phone]   [facility]  [address], [place] [phone], [place], [place] [phone]   [facility] [address], [place] [phone] Admissions: 8am-3pm M-F  [facility] [address]    [place], [place] [phone]   [facility] [address], [place], [place] [phone]   [facility] [address]  [place], [place] [phone]   [facility] Intensive Outpatient [address], [place], [place] [phone]   [facility] ([facility] [address]  [place], [place] [phone])[phone] or (430)[phone](480) [phone]   [facility] ([facility] [address] [place], [place] [phone] Accepts Medicaid  [facility] [address]  [place] [phone] Substance Abuse/Addiction Treatment   [facility] Organization         Address  Phone  Notes  [facility]  816-369-9222(888) [phone]   Angie FavaJulie Brannon, PhD [address], [address] [place], [place]   [phone](336) [phone] or [phone](336) (586) [phone]   [facility]   [address] [place], [place] [phone](336) (757)[phone]   [facility] [address], [place], [place] ([phone](336) [phone] Insurance/Medicaid/sponsorship through [facility] [address]                                    [place], [place] [phone]) [phone] Therapy/tele-psych/case    [facility] [address]   [place], [place] ([phone](336) [phone]    Dr. Lolly MustacheArfeen  [phone]) [phone]   [facility] [place]  [employer] [facility] 1) [address], [place] 2) [address], [place] 3)  [address], [place] [phone](336) ([phone](336) [phone]  [phone](336) [phone]   [facility] Child Abuse Hotline 812-828-9844(336) 941-716-0881 or (937) [phone](336) [phone] (After Hours)      Take the prescription as directed.  Take over the counter benadryl, as directed on packaging, as needed for itching.  If the benadryl is too sedating, take an over the counter non-sedating antihistamine such as claritin, allegra or zyrtec, as directed on packaging. Apply ice to the area(s) of discomfort, for 15 minutes at a time, several times per day for the next few days.  Do not fall asleep on an ice pack. Take tylenol, as directed on packaging, as needed for discomfort. Call your regular medical doctor today to schedule a follow up appointment within the next 2 days.  Return to the Emergency Department immediately sooner if worsening.

## 2014-04-26 NOTE — ED Notes (Signed)
Pt's brother brought in from truck outside to sit with pt

## 2014-04-26 NOTE — ED Notes (Signed)
No redness or selling on side of face observed at this time . Pt states that his face is sore. But feels a little better

## 2014-05-23 ENCOUNTER — Encounter (HOSPITAL_COMMUNITY): Payer: Self-pay | Admitting: Emergency Medicine

## 2014-05-23 ENCOUNTER — Emergency Department (HOSPITAL_COMMUNITY)
Admission: EM | Admit: 2014-05-23 | Discharge: 2014-05-23 | Disposition: A | Discharge status: Home / Self Care | Payer: Medicare Other | Attending: Emergency Medicine | Admitting: Emergency Medicine

## 2014-05-23 DIAGNOSIS — F17200 Nicotine dependence, unspecified, uncomplicated: Secondary | ICD-10-CM | POA: Diagnosis not present

## 2014-05-23 DIAGNOSIS — Y929 Unspecified place or not applicable: Secondary | ICD-10-CM | POA: Diagnosis not present

## 2014-05-23 DIAGNOSIS — Z79899 Other long term (current) drug therapy: Secondary | ICD-10-CM | POA: Diagnosis not present

## 2014-05-23 DIAGNOSIS — Y9389 Activity, other specified: Secondary | ICD-10-CM | POA: Insufficient documentation

## 2014-05-23 DIAGNOSIS — Z7982 Long term (current) use of aspirin: Secondary | ICD-10-CM | POA: Insufficient documentation

## 2014-05-23 DIAGNOSIS — I10 Essential (primary) hypertension: Secondary | ICD-10-CM | POA: Insufficient documentation

## 2014-05-23 DIAGNOSIS — T6391XA Toxic effect of contact with unspecified venomous animal, accidental (unintentional), initial encounter: Secondary | ICD-10-CM | POA: Insufficient documentation

## 2014-05-23 DIAGNOSIS — T63441A Toxic effect of venom of bees, accidental (unintentional), initial encounter: Secondary | ICD-10-CM

## 2014-05-23 DIAGNOSIS — T63461A Toxic effect of venom of wasps, accidental (unintentional), initial encounter: Secondary | ICD-10-CM | POA: Diagnosis not present

## 2014-05-23 MED ORDER — FAMOTIDINE 20 MG PO TABS
20.0000 mg | ORAL_TABLET | Freq: Once | ORAL | Status: AC
  Administered 2014-05-23: 20 mg via ORAL
  Filled 2014-05-23: qty 1

## 2014-05-23 MED ORDER — DIPHENHYDRAMINE HCL 25 MG PO CAPS
25.0000 mg | ORAL_CAPSULE | Freq: Once | ORAL | Status: AC
  Administered 2014-05-23: 25 mg via ORAL
  Filled 2014-05-23: qty 1

## 2014-05-23 MED ORDER — DIPHENHYDRAMINE HCL 25 MG PO TABS: 25.0000 mg | ORAL_TABLET | Freq: Four times a day (QID) | ORAL | Status: DC | PRN

## 2014-05-23 MED ORDER — PREDNISONE 20 MG PO TABS
40.0000 mg | ORAL_TABLET | Freq: Once | ORAL | Status: AC
  Administered 2014-05-23: 40 mg via ORAL
  Filled 2014-05-23: qty 2

## 2014-05-23 MED ORDER — PREDNISONE 20 MG PO TABS: 40.0000 mg | ORAL_TABLET | Freq: Every day | ORAL | Status: DC

## 2014-05-23 NOTE — ED Provider Notes (Signed)
CSN: [id number]     Arrival date & time 05/23/14  1050 History   First MD Initiated Contact with Patient 05/23/14 1052     Chief Complaint  Patient presents with  . Insect Bite   Dennis Ruiz is a 73 y.o. male who presents to the Emergency Department complaining of bee sting to the top of his right hand that occurred yesterday.  He states the area is painful and he woke up this morning with redness, itching and swelling to his hand.  He did not apply ice or take any medications prior to ER arrival.  He was seen here last month for same.  He denies difficulty swallowing, breathing, rash, numbness or weakness of the affected extremity,  shortness of breath or facial swelling  (Consider location/radiation/quality/duration/timing/severity/associated sxs/prior Treatment) The history is provided by the patient.    Past Medical History  Diagnosis Date  . Back pain   . Chest pain   . Hypertension    Past Surgical History  Procedure Laterality Date  . Hernia repair    . Transurethral resection of prostate     Family History  Problem Relation Age of Onset  . Cancer Father    History  Substance Use Topics  . Smoking status: Current Every Day Smoker -- 1.00 packs/day    Types: Cigarettes  . Smokeless tobacco: Not on file     Comment: pt reprots pack every two weeks.  . Alcohol Use: Yes     Comment: socail    Review of Systems  Constitutional: Negative for fever and chills.  HENT: Negative for facial swelling, trouble swallowing and voice change.   Respiratory: Negative for chest tightness and shortness of breath.   Cardiovascular: Negative for chest pain.  Genitourinary: Negative for dysuria and difficulty urinating.  Musculoskeletal: Positive for arthralgias and joint swelling.  Skin: Positive for color change. Negative for rash and wound.       Bee sting to dorsal right hand  All other systems reviewed and are negative.     Allergies  Review of patient's allergies  indicates no known allergies.  Home Medications   Prior to Admission medications   Medication Sig Start Date End Date Taking? Authorizing Provider  aspirin EC 81 MG tablet Take 81 mg by mouth every morning.    Historical Provider, MD  Cholecalciferol (VITAMIN D PO) Take 2 tablets by mouth daily.    Historical Provider, MD  Iron-Vitamins (GERITOL COMPLETE PO) Take 1 tablet by mouth every morning.    Historical Provider, MD  Multiple Vitamins-Minerals (HAIR/SKIN/NAILS PO) Take 1 tablet by mouth daily.    Historical Provider, MD  Multiple Vitamins-Minerals (ICAPS PO) Take 1 capsule by mouth daily.    Historical Provider, MD  predniSONE (DELTASONE) 20 MG tablet Take 2 tablets (40 mg total) by mouth daily. Start 04/27/2014 04/26/14   Laray AngerKathleen M McManus, DO  triamterene-hydrochlorothiazide (MAXZIDE-25) 37.5-25 MG per tablet Take 0.5 tablets by mouth daily as needed (When pressure is high). For high blood pressure    Historical Provider, MD  VITAMIN E PO Take 1 tablet by mouth every morning.    Historical Provider, MD   BP 117/78  Pulse 84  Temp(Src) 98 F (36.7 C) (Oral)  Resp 18  Ht 6\' 3"  (1.905 m)  Wt 205 lb (92.987 kg)  BMI 25.62 kg/m2  SpO2 99% Physical Exam  Nursing note and vitals reviewed. Constitutional: He is oriented to person, place, and time. He appears well-developed and well-nourished. No distress.  HENT:  Head: Normocephalic and atraumatic.  Mouth/Throat: Uvula is midline, oropharynx is clear and moist and mucous membranes are normal. No trismus in the jaw. No uvula swelling.  Eyes: Conjunctivae and EOM are normal. Pupils are equal, round, and reactive to light.  Neck: Normal range of motion. Neck supple.  Cardiovascular: Normal rate, regular rhythm, normal heart sounds and intact distal pulses.   No murmur heard. Pulmonary/Chest: Effort normal and breath sounds normal. No respiratory distress. He has no wheezes. He exhibits no tenderness.  Musculoskeletal: Normal range of  motion. He exhibits edema and tenderness.  Localized ttp of the dorsal right hand.  Mild to moderate edema and erythema of the dorsal hand.  Distal sensation intact.  Pt has full ROM of the fingers and wrist.    Lymphadenopathy:    He has no cervical adenopathy.  Neurological: He is alert and oriented to person, place, and time. He exhibits normal muscle tone. Coordination normal.  Skin: Skin is warm and dry. No rash noted. No erythema.    ED Course  Procedures (including critical care time) Labs Review Labs Reviewed - No data to display  Imaging Review No results found.   EKG Interpretation None      MDM   Final diagnoses:  Local reaction to bee sting, accidental or unintentional, initial encounter     Pt is well appearing.  Bee sting to right hand yesterday.  Localized reaction only.  Airway patent.  He agrees to elevate, ice and treatment with benadryl and prednisone.  Advised to return here if needed.  VSS.  Talking on cell phone, stable for d/c    Lizann Edelman L. Trisha Mangle, PA-C 05/23/14 1218

## 2014-05-23 NOTE — ED Provider Notes (Signed)
Medical screening examination/treatment/procedure(s) were performed by non-physician practitioner and as supervising physician I was immediately available for consultation/collaboration.   EKG Interpretation None        Vivan Agostino L Esra Frankowski, MD 05/23/14 1305 

## 2014-05-23 NOTE — ED Notes (Signed)
Bee sting, to rt hand yesterday, swelling present

## 2014-05-23 NOTE — Discharge Instructions (Signed)

## 2015-04-10 DIAGNOSIS — I861 Scrotal varices: Secondary | ICD-10-CM | POA: Insufficient documentation

## 2015-04-18 ENCOUNTER — Emergency Department (HOSPITAL_COMMUNITY)
Admission: EM | Admit: 2015-04-18 | Discharge: 2015-04-18 | Disposition: A | Discharge status: Home / Self Care | Payer: Medicare Other | Attending: Emergency Medicine | Admitting: Emergency Medicine

## 2015-04-18 ENCOUNTER — Encounter (HOSPITAL_COMMUNITY): Payer: Self-pay | Admitting: Emergency Medicine

## 2015-04-18 DIAGNOSIS — Y929 Unspecified place or not applicable: Secondary | ICD-10-CM | POA: Insufficient documentation

## 2015-04-18 DIAGNOSIS — Z79899 Other long term (current) drug therapy: Secondary | ICD-10-CM | POA: Insufficient documentation

## 2015-04-18 DIAGNOSIS — Z720 Tobacco use: Secondary | ICD-10-CM | POA: Insufficient documentation

## 2015-04-18 DIAGNOSIS — W228XXA Striking against or struck by other objects, initial encounter: Secondary | ICD-10-CM | POA: Insufficient documentation

## 2015-04-18 DIAGNOSIS — Y9389 Activity, other specified: Secondary | ICD-10-CM | POA: Diagnosis not present

## 2015-04-18 DIAGNOSIS — S0101XA Laceration without foreign body of scalp, initial encounter: Secondary | ICD-10-CM | POA: Diagnosis not present

## 2015-04-18 DIAGNOSIS — Y998 Other external cause status: Secondary | ICD-10-CM | POA: Diagnosis not present

## 2015-04-18 DIAGNOSIS — I10 Essential (primary) hypertension: Secondary | ICD-10-CM | POA: Diagnosis not present

## 2015-04-18 DIAGNOSIS — S0003XA Contusion of scalp, initial encounter: Secondary | ICD-10-CM

## 2015-04-18 DIAGNOSIS — Z7952 Long term (current) use of systemic steroids: Secondary | ICD-10-CM | POA: Diagnosis not present

## 2015-04-18 MED ORDER — HYDROCODONE-ACETAMINOPHEN 5-325 MG PO TABS
1.0000 | ORAL_TABLET | Freq: Once | ORAL | Status: AC
  Administered 2015-04-18: 1 via ORAL
  Filled 2015-04-18: qty 1

## 2015-04-18 NOTE — ED Notes (Signed)
Called to room at 1906 and no answer

## 2015-04-18 NOTE — ED Notes (Signed)
Laceration to top of head, pt states he hit his head on a piece of lumbar as he stood up. Denies loc. nad noted.

## 2015-04-18 NOTE — ED Provider Notes (Signed)
CSN: [id number]     Arrival date & time 04/18/15  1659 History   First MD Initiated Contact with Patient 04/18/15 1939     Chief Complaint  Patient presents with  . Laceration     (Consider location/radiation/quality/duration/timing/severity/associated sxs/prior Treatment) Patient is a 74 y.o. male presenting with skin laceration.  Laceration Location:  Head/neck Head/neck laceration location:  Scalp Length (cm):  2 Depth:  Through dermis Quality: straight   Bleeding: controlled with pressure   Injury mechanism: wood. Pain details:    Quality:  Aching   Severity:  Moderate   Timing:  Constant   Progression:  Unchanged Foreign body present:  No foreign bodies Relieved by:  None tried Worsened by:  Nothing tried Ineffective treatments:  None tried Tetanus status:  Up to date  Dennis Ruiz is a 74 y.o. male who presents to the ED with a laceration to the scalp. He states he stood up and a piece of lumber was sticking out and he hit his head. He denies LOC or other injuries.   Past Medical History  Diagnosis Date  . Back pain   . Chest pain   . Hypertension    Past Surgical History  Procedure Laterality Date  . Hernia repair    . Transurethral resection of prostate     Family History  Problem Relation Age of Onset  . Cancer Father    History  Substance Use Topics  . Smoking status: Current Every Day Smoker -- 1.00 packs/day    Types: Cigarettes  . Smokeless tobacco: Not on file     Comment: pt reprots pack every two weeks.  . Alcohol Use: Yes     Comment: socail    Review of Systems Negative except as stated in HPI   Allergies  Review of patient's allergies indicates no known allergies.  Home Medications   Prior to Admission medications   Medication Sig Start Date End Date Taking? Authorizing Provider  aspirin EC 81 MG tablet Take 81 mg by mouth every morning.    Historical Provider, MD  Cholecalciferol (VITAMIN D PO) Take 2 tablets by mouth daily.     Historical Provider, MD  diphenhydrAMINE (BENADRYL) 25 MG tablet Take 1 tablet (25 mg total) by mouth every 6 (six) hours as needed for itching. 05/23/14   Tammy Triplett, PA-C  Iron-Vitamins (GERITOL COMPLETE PO) Take 1 tablet by mouth every morning.    Historical Provider, MD  Multiple Vitamins-Minerals (HAIR/SKIN/NAILS PO) Take 1 tablet by mouth daily.    Historical Provider, MD  Multiple Vitamins-Minerals (ICAPS PO) Take 1 capsule by mouth daily.    Historical Provider, MD  predniSONE (DELTASONE) 20 MG tablet Take 2 tablets (40 mg total) by mouth daily. Start 04/27/2014 04/26/14   Samuel JesterKathleen McManus, DO  predniSONE (DELTASONE) 20 MG tablet Take 2 tablets (40 mg total) by mouth daily. For 5 days 05/23/14   Tammy Triplett, PA-C  triamterene-hydrochlorothiazide (MAXZIDE-25) 37.5-25 MG per tablet Take 0.5 tablets by mouth daily as needed (When pressure is high). For high blood pressure    Historical Provider, MD  VITAMIN E PO Take 1 tablet by mouth every morning.    Historical Provider, MD   BP 160/92 mmHg  Pulse 109  Temp(Src) 97.4 F (36.3 C) (Oral)  Resp 18  SpO2 95% Physical Exam  Constitutional: He is oriented to person, place, and time. He appears well-developed and well-nourished.  HENT:  Head: Head is with laceration.    Right Ear: Tympanic membrane  normal.  Left Ear: Tympanic membrane normal.  Nose: Nose normal.  Mouth/Throat: Uvula is midline, oropharynx is clear and moist and mucous membranes are normal.  Superficial laceration to the scalp.   Eyes: Conjunctivae and EOM are normal. Pupils are equal, round, and reactive to light.  Neck: Normal range of motion. Neck supple.  Cardiovascular: Normal rate.   Pulmonary/Chest: Effort normal.  Musculoskeletal: Normal range of motion.  Neurological: He is alert and oriented to person, place, and time. No cranial nerve deficit.  Skin: Skin is warm and dry.  Psychiatric: He has a normal mood and affect. His behavior is normal.  Nursing note  and vitals reviewed.   ED Course  Procedures (including critical care time)  Wound cleaned with sur cleanse Closed with dermabond Patient tolerated procedure without any immediate complications.   Labs Review Labs Reviewed - No data to display   MDM  74 y.o. male with superficial scalp laceration and contusion stable for d/c without focal neuro deficits. Discussed with the patient and all questioned fully answered. He will return if any problems arise.  Final diagnoses:  Laceration of scalp, initial encounter  Contusion of scalp, initial encounter       [facility], NP 04/19/15 8469  Zadie Rhine, MD 04/19/15 [phone]

## 2015-10-08 ENCOUNTER — Emergency Department (HOSPITAL_COMMUNITY)
Admission: EM | Admit: 2015-10-08 | Discharge: 2015-10-08 | Disposition: A | Discharge status: Home / Self Care | Payer: Medicare Other | Attending: Emergency Medicine | Admitting: Emergency Medicine

## 2015-10-08 ENCOUNTER — Emergency Department (HOSPITAL_COMMUNITY): Payer: Medicare Other

## 2015-10-08 ENCOUNTER — Encounter (HOSPITAL_COMMUNITY): Payer: Self-pay | Admitting: Emergency Medicine

## 2015-10-08 DIAGNOSIS — R531 Weakness: Secondary | ICD-10-CM | POA: Diagnosis present

## 2015-10-08 DIAGNOSIS — F17210 Nicotine dependence, cigarettes, uncomplicated: Secondary | ICD-10-CM | POA: Diagnosis not present

## 2015-10-08 DIAGNOSIS — E860 Dehydration: Secondary | ICD-10-CM | POA: Insufficient documentation

## 2015-10-08 DIAGNOSIS — I10 Essential (primary) hypertension: Secondary | ICD-10-CM | POA: Diagnosis not present

## 2015-10-08 DIAGNOSIS — Z7982 Long term (current) use of aspirin: Secondary | ICD-10-CM | POA: Insufficient documentation

## 2015-10-08 DIAGNOSIS — Z79899 Other long term (current) drug therapy: Secondary | ICD-10-CM | POA: Diagnosis not present

## 2015-10-08 LAB — URINALYSIS, ROUTINE W REFLEX MICROSCOPIC
BILIRUBIN URINE: NEGATIVE
GLUCOSE, UA: NEGATIVE mg/dL
HGB URINE DIPSTICK: NEGATIVE
Leukocytes, UA: NEGATIVE
NITRITE: NEGATIVE
PH: 6 (ref 5.0–8.0)
Protein, ur: NEGATIVE mg/dL
Specific Gravity, Urine: 1.025 (ref 1.005–1.030)

## 2015-10-08 LAB — COMPREHENSIVE METABOLIC PANEL
ALBUMIN: 4.3 g/dL (ref 3.5–5.0)
ALK PHOS: 70 U/L (ref 38–126)
ALT: 17 U/L (ref 17–63)
AST: 22 U/L (ref 15–41)
Anion gap: 9 (ref 5–15)
BILIRUBIN TOTAL: 0.5 mg/dL (ref 0.3–1.2)
BUN: 25 mg/dL — AB (ref 6–20)
CALCIUM: 9 mg/dL (ref 8.9–10.3)
CO2: 28 mmol/L (ref 22–32)
Chloride: 97 mmol/L — ABNORMAL LOW (ref 101–111)
Creatinine, Ser: 1.54 mg/dL — ABNORMAL HIGH (ref 0.61–1.24)
GFR calc Af Amer: 50 mL/min — ABNORMAL LOW (ref >60)
GFR, EST NON AFRICAN AMERICAN: 43 mL/min — AB (ref >60)
GLUCOSE: 101 mg/dL — AB (ref 65–99)
Potassium: 3.8 mmol/L (ref 3.5–5.1)
Sodium: 134 mmol/L — ABNORMAL LOW (ref 135–145)
TOTAL PROTEIN: 8 g/dL (ref 6.5–8.1)

## 2015-10-08 LAB — I-STAT CG4 LACTIC ACID, ED: LACTIC ACID, VENOUS: 1.34 mmol/L (ref 0.5–2.0)

## 2015-10-08 LAB — CBC WITH DIFFERENTIAL/PLATELET
BASOS ABS: 0 10*3/uL (ref 0.0–0.1)
BASOS PCT: 0 %
Eosinophils Absolute: 0.1 10*3/uL (ref 0.0–0.7)
Eosinophils Relative: 2 %
HEMATOCRIT: 44.7 % (ref 39.0–52.0)
HEMOGLOBIN: 15.2 g/dL (ref 13.0–17.0)
LYMPHS PCT: 37 %
Lymphs Abs: 1.3 10*3/uL (ref 0.7–4.0)
MCH: 31.7 pg (ref 26.0–34.0)
MCHC: 34 g/dL (ref 30.0–36.0)
MCV: 93.3 fL (ref 78.0–100.0)
MONO ABS: 0.5 10*3/uL (ref 0.1–1.0)
MONOS PCT: 16 %
NEUTROS ABS: 1.6 10*3/uL — AB (ref 1.7–7.7)
NEUTROS PCT: 46 %
Platelets: 109 10*3/uL — ABNORMAL LOW (ref 150–400)
RBC: 4.79 MIL/uL (ref 4.22–5.81)
RDW: 13.3 % (ref 11.5–15.5)
WBC: 3.4 10*3/uL — ABNORMAL LOW (ref 4.0–10.5)

## 2015-10-08 MED ORDER — SODIUM CHLORIDE 0.9 % IV BOLUS (SEPSIS)
1000.0000 mL | Freq: Once | INTRAVENOUS | Status: AC
Start: 2015-10-08 — End: 2015-10-08
  Administered 2015-10-08: 1000 mL via INTRAVENOUS

## 2015-10-08 NOTE — ED Provider Notes (Signed)
CSN: [id number]     Arrival date & time 10/08/15  1507 History   First MD Initiated Contact with Patient 10/08/15 1538     Chief Complaint  Patient presents with  . Hypotension     (Consider location/radiation/quality/duration/timing/severity/associated sxs/prior Treatment) Patient is a 74 y.o. male presenting with weakness. The history is provided by the patient (The patient states that he feels weak.  His blood pressure has been running low).  Weakness This is a new problem. The current episode started more than 2 days ago. The problem occurs constantly. The problem has not changed since onset.Pertinent negatives include no chest pain, no abdominal pain and no headaches. Nothing aggravates the symptoms. Nothing relieves the symptoms.    Past Medical History  Diagnosis Date  . Back pain   . Chest pain   . Hypertension    Past Surgical History  Procedure Laterality Date  . Hernia repair    . Transurethral resection of prostate     Family History  Problem Relation Age of Onset  . Cancer Father    Social History  Substance Use Topics  . Smoking status: Current Every Day Smoker -- 1.00 packs/day    Types: Cigarettes  . Smokeless tobacco: None     Comment: pt reprots pack every two weeks.  . Alcohol Use: Yes     Comment: socail    Review of Systems  Constitutional: Positive for fatigue. Negative for appetite change.  HENT: Negative for congestion, ear discharge and sinus pressure.   Eyes: Negative for discharge.  Respiratory: Negative for cough.   Cardiovascular: Negative for chest pain.  Gastrointestinal: Negative for abdominal pain and diarrhea.  Genitourinary: Negative for frequency and hematuria.  Musculoskeletal: Negative for back pain.  Skin: Negative for rash.  Neurological: Positive for weakness. Negative for seizures and headaches.  Psychiatric/Behavioral: Negative for hallucinations.      Allergies  Review of patient's allergies indicates no known  allergies.  Home Medications   Prior to Admission medications   Medication Sig Start Date End Date Taking? Authorizing Provider  aspirin EC 81 MG tablet Take 81 mg by mouth every morning.   Yes Historical Provider, MD  Cholecalciferol (VITAMIN D PO) Take 2 tablets by mouth daily.   Yes Historical Provider, MD  diazepam (VALIUM) 5 MG tablet Take 5 mg by mouth daily.  09/20/15  Yes Historical Provider, MD  HYDROcodone-acetaminophen (NORCO) 7.5-325 MG tablet Take 1 tablet by mouth every 6 (six) hours as needed for moderate pain.   Yes Historical Provider, MD  Iron-Vitamins (GERITOL COMPLETE PO) Take 1 tablet by mouth every morning.   Yes Historical Provider, MD  Multiple Vitamins-Minerals (HAIR/SKIN/NAILS PO) Take 1 tablet by mouth daily.   Yes Historical Provider, MD  Multiple Vitamins-Minerals (ICAPS PO) Take 1 capsule by mouth daily.   Yes Historical Provider, MD  temazepam (RESTORIL) 30 MG capsule Take 30 mg by mouth at bedtime as needed for sleep.  09/05/15  Yes Historical Provider, MD  triamterene-hydrochlorothiazide (MAXZIDE-25) 37.5-25 MG per tablet Take 0.5 tablets by mouth daily as needed (When pressure is high). For high blood pressure   Yes Historical Provider, MD  VITAMIN E PO Take 1 tablet by mouth every morning.   Yes Historical Provider, MD   BP 111/81 mmHg  Pulse 77  Temp(Src) 97.5 F (36.4 C) (Oral)  Resp 16  Ht  (1.905 m)  Wt 200 lb (90.719 kg)  BMI 25.00 kg/m2  SpO2 100% Physical Exam  Constitutional: He is  oriented to person, place, and time. He appears well-developed.  HENT:  Head: Normocephalic.  Eyes: Conjunctivae and EOM are normal. No scleral icterus.  Neck: Neck supple. No thyromegaly present.  Cardiovascular: Normal rate and regular rhythm.  Exam reveals no gallop and no friction rub.   No murmur heard. Pulmonary/Chest: No stridor. He has no wheezes. He has no rales. He exhibits no tenderness.  Abdominal: He exhibits no distension. There is no  tenderness. There is no rebound.  Musculoskeletal: Normal range of motion. He exhibits no edema.  Lymphadenopathy:    He has no cervical adenopathy.  Neurological: He is oriented to person, place, and time. He exhibits normal muscle tone. Coordination normal.  Skin: No rash noted. No erythema.  Psychiatric: He has a normal mood and affect. His behavior is normal.    ED Course  Procedures (including critical care time) Labs Review Labs Reviewed  CBC WITH DIFFERENTIAL/PLATELET - Abnormal; Notable for the following:    WBC 3.4 (*)    Platelets 109 (*)    Neutro Abs 1.6 (*)    All other components within normal limits  COMPREHENSIVE METABOLIC PANEL - Abnormal; Notable for the following:    Sodium 134 (*)    Chloride 97 (*)    Glucose, Bld 101 (*)    BUN 25 (*)    Creatinine, Ser 1.54 (*)    GFR calc non Af Amer 43 (*)    GFR calc Af Amer 50 (*)    All other components within normal limits  URINALYSIS, ROUTINE W REFLEX MICROSCOPIC (NOT AT [facility]) - Abnormal; Notable for the [facility]:    Ketones, ur TRACE (*)    All other components within normal limits  I-STAT CG4 LACTIC ACID, ED  I-STAT CG4 LACTIC ACID, ED    Imaging Review Dg Abd Acute W/chest  10/08/2015  CLINICAL DATA:  Weakness and dizziness ; hypotension EXAM: DG ABDOMEN ACUTE W/ 1V CHEST COMPARISON:  Chest radiograph January 08, 2012; CT abdomen and pelvis Feb 29, 2012 FINDINGS: PA chest: There are calcified granulomas in the left mid lung. There is no edema or consolidation. Heart size and pulmonary vascularity are normal. No adenopathy. There is mid thoracic dextroscoliosis with degenerative change in the thoracic spine. Supine and upright abdomen: There is moderate diffuse stool throughout the colon. There is no bowel dilatation or air-fluid level suggesting obstruction. No free air. There is degenerative change in both hip joints. IMPRESSION: Moderate diffuse stool throughout colon. No obstruction or free air. Calcified  granulomas left lung. No lung edema or consolidation. Electronically Signed   By: Bretta BangWilliam  Woodruff III M.D.   On: 10/08/2015 17:16   I have personally reviewed and evaluated these images and lab results as part of my medical decision-making.   EKG Interpretation None      MDM   Final diagnoses:  Dehydration    Lab work is consistent with dehydration patient given IV fluids and feels much better.  His blood pressure has been normal here.  He is told to drink more fluids and to stop his Maxzide.  He is follow-up with PCP    Bethann BerkshireJoseph Amera Banos, MD 10/08/15 [phone]

## 2015-10-08 NOTE — Discharge Instructions (Signed)
Drink more fluids.  Stop taking your Maxzide.  Follow-up with your family doctor in a week

## 2015-10-08 NOTE — ED Notes (Signed)
Patient states hypotension at home. Reports systolic BP of 90. Reports normal pressures are around 120. Denies recent illnesses. Denies pain. Reports feeling lightheaded yesterday.

## 2016-03-23 ENCOUNTER — Emergency Department (HOSPITAL_COMMUNITY)
Admission: EM | Admit: 2016-03-23 | Discharge: 2016-03-23 | Disposition: A | Discharge status: Home / Self Care | Payer: Medicare Other | Attending: Emergency Medicine | Admitting: Emergency Medicine

## 2016-03-23 ENCOUNTER — Encounter (HOSPITAL_COMMUNITY): Payer: Self-pay | Admitting: Emergency Medicine

## 2016-03-23 DIAGNOSIS — F17210 Nicotine dependence, cigarettes, uncomplicated: Secondary | ICD-10-CM | POA: Insufficient documentation

## 2016-03-23 DIAGNOSIS — I10 Essential (primary) hypertension: Secondary | ICD-10-CM | POA: Diagnosis not present

## 2016-03-23 DIAGNOSIS — N390 Urinary tract infection, site not specified: Secondary | ICD-10-CM

## 2016-03-23 DIAGNOSIS — R319 Hematuria, unspecified: Secondary | ICD-10-CM | POA: Insufficient documentation

## 2016-03-23 DIAGNOSIS — Z7982 Long term (current) use of aspirin: Secondary | ICD-10-CM | POA: Diagnosis not present

## 2016-03-23 DIAGNOSIS — R300 Dysuria: Secondary | ICD-10-CM | POA: Diagnosis present

## 2016-03-23 DIAGNOSIS — Z79899 Other long term (current) drug therapy: Secondary | ICD-10-CM | POA: Diagnosis not present

## 2016-03-23 LAB — URINE MICROSCOPIC-ADD ON

## 2016-03-23 LAB — URINALYSIS, ROUTINE W REFLEX MICROSCOPIC
Bilirubin Urine: NEGATIVE
GLUCOSE, UA: NEGATIVE mg/dL
KETONES UR: NEGATIVE mg/dL
Nitrite: POSITIVE — AB
PH: 6 (ref 5.0–8.0)
Protein, ur: 100 mg/dL — AB
SPECIFIC GRAVITY, URINE: 1.025 (ref 1.005–1.030)

## 2016-03-23 MED ORDER — CEPHALEXIN 500 MG PO CAPS
500.0000 mg | ORAL_CAPSULE | Freq: Once | ORAL | Status: AC
Start: 2016-03-23 — End: 2016-03-23
  Administered 2016-03-23: 500 mg via ORAL
  Filled 2016-03-23: qty 1

## 2016-03-23 MED ORDER — CEPHALEXIN 500 MG PO CAPS: 500.0000 mg | ORAL_CAPSULE | Freq: Four times a day (QID) | ORAL | Status: DC

## 2016-03-23 NOTE — ED Notes (Signed)
Pt states yesterday he began having difficulty urinating with only a little coming out as well as burning.

## 2016-03-23 NOTE — ED Provider Notes (Signed)
CSN: [id number]     Arrival date & time 03/23/16  1812 History   First MD Initiated Contact with Patient 03/23/16 1825     Chief Complaint  Patient presents with  . Urinary Retention     (Consider location/radiation/quality/duration/timing/severity/associated sxs/prior Treatment) HPI   Dennis Ruiz is a 75 y.o. male who presents for evaluation of increasing difficulty to urinate. Onset of symptoms several days ago, today has been only able to urinate a couple of ounces. He denies fever, chills, nausea, vomiting, cough, shortness of breath or chest pain. History of TURP, 6 years ago.  He has mild dysuria today. No hematuria or urinary frequency. There are no other known modifying factors.  Past Medical History  Diagnosis Date  . Back pain   . Chest pain   . Hypertension    Past Surgical History  Procedure Laterality Date  . Hernia repair    . Transurethral resection of prostate     Family History  Problem Relation Age of Onset  . Cancer Father    Social History  Substance Use Topics  . Smoking status: Current Every Day Smoker -- 1.00 packs/day    Types: Cigarettes  . Smokeless tobacco: None     Comment: pt reprots pack every two weeks.  . Alcohol Use: Yes     Comment: socail    Review of Systems  All other systems reviewed and are negative.     Allergies  Review of patient's allergies indicates no known allergies.  Home Medications   Prior to Admission medications   Medication Sig Start Date End Date Taking? Authorizing Provider  aspirin EC 81 MG tablet Take 81 mg by mouth every morning.   Yes Historical Provider, MD  Cholecalciferol (VITAMIN D PO) Take 2 tablets by mouth daily.   Yes Historical Provider, MD  diazepam (VALIUM) 5 MG tablet Take 5 mg by mouth daily as needed for anxiety.  09/20/15  Yes Historical Provider, MD  HYDROcodone-acetaminophen (NORCO) 7.5-325 MG tablet Take 0.5 tablets by mouth every 6 (six) hours as needed for moderate pain.    Yes  Historical Provider, MD  Iron-Vitamins (GERITOL COMPLETE PO) Take 1 tablet by mouth every morning.   Yes Historical Provider, MD  Multiple Vitamins-Minerals (ICAPS PO) Take 1 capsule by mouth daily.   Yes Historical Provider, MD  temazepam (RESTORIL) 30 MG capsule Take 30 mg by mouth at bedtime as needed for sleep.  09/05/15  Yes Historical Provider, MD  triamterene-hydrochlorothiazide (MAXZIDE-25) 37.5-25 MG per tablet Take 0.5 tablets by mouth daily. For high blood pressure   Yes Historical Provider, MD  VITAMIN E PO Take 1 tablet by mouth every morning.   Yes Historical Provider, MD  cephALEXin (KEFLEX) 500 MG capsule Take 1 capsule (500 mg total) by mouth 4 (four) times daily. 03/23/16   Dennis Bale, MD   BP 142/76 mmHg  Pulse 77  Temp(Src) 97.8 F (36.6 C) (Oral)  Resp 18  SpO2 100% Physical Exam  Constitutional: He is oriented to person, place, and time. He appears well-developed and well-nourished.  HENT:  Head: Normocephalic and atraumatic.  Right Ear: External ear normal.  Left Ear: External ear normal.  Eyes: Conjunctivae and EOM are normal. Pupils are equal, round, and reactive to light.  Neck: Normal range of motion and phonation normal. Neck supple.  Cardiovascular: Normal rate, regular rhythm and normal heart sounds.   Pulmonary/Chest: Effort normal and breath sounds normal. He exhibits no bony tenderness.  Abdominal: Soft. There is  tenderness (Suprapubic, mild).  Musculoskeletal: Normal range of motion.  Neurological: He is alert and oriented to person, place, and time. No cranial nerve deficit or sensory deficit. He exhibits normal muscle tone. Coordination normal.  Skin: Skin is warm, dry and intact.  Psychiatric: He has a normal mood and affect. His behavior is normal. Judgment and thought content normal.  Nursing note and vitals reviewed.   ED Course  Procedures (including critical care time) Medications  cephALEXin (KEFLEX) capsule 500 mg (not administered)     Patient Vitals for the past 24 hrs:  BP Temp Temp src Pulse Resp SpO2  03/23/16 1819 142/76 mmHg 97.8 F (36.6 C) Oral 77 18 100 %    Bladder scan less than 100 cc urine.   7:54 PM Reevaluation with update and discussion. After initial assessment and treatment, an updated evaluation reveals he remains fairly comfortable. Abdomen soft. Findings discussed with the patient, all questions answered. Dennis Ruiz     Labs Review Labs Reviewed  URINALYSIS, ROUTINE W REFLEX MICROSCOPIC (NOT AT [facility]) - Abnormal; Notable for the following:    APPearance CLOUDY (*)    Hgb urine dipstick LARGE (*)    Protein, ur 100 (*)    Nitrite POSITIVE (*)    Leukocytes, UA MODERATE (*)    All other components within normal limits  URINE MICROSCOPIC-ADD ON - Abnormal; Notable for the following:    Squamous Epithelial / LPF 0-5 (*)    Bacteria, UA FEW (*)    All other components within normal limits  URINE CULTURE    Imaging Review No results found. I have personally reviewed and evaluated these images and lab results as part of my medical decision-making.   EKG Interpretation None      MDM   Final diagnoses:  Urinary tract infection with hematuria, site unspecified    On, given urinary tract infection. No outflow obstruction. Doubt sepsis or metabolic instability.  Nursing Notes Reviewed/ Care Coordinated Applicable Imaging Reviewed Interpretation of Laboratory Data incorporated into ED treatment  The patient appears reasonably screened and/or stabilized for discharge and I doubt any other medical condition or other [facility] requiring further screening, evaluation, or treatment in the ED at this time prior to discharge.  Plan: Home Medications- Keflex; Home Treatments- fluids; return here if the recommended treatment, does not improve the symptoms; Recommended follow up- PCP 1 week for check up     Dennis BaleElliott Kember Boch, MD 03/23/16 [id number]

## 2016-03-23 NOTE — Discharge Instructions (Signed)

## 2016-03-26 LAB — URINE CULTURE

## 2016-03-27 ENCOUNTER — Telehealth: Payer: Self-pay | Admitting: *Deleted

## 2016-03-27 NOTE — Progress Notes (Signed)
ED Antimicrobial Stewardship Positive Culture Follow Up   Dennis Ruiz is an 75 y.o. male who [facility] with a chief complaint of  Chief Complaint  Patient presents with  . Urinary Retention    Recent Results (from the past 720 hour(s))  Urine culture     Status: Abnormal   Collection Time: 03/23/16  6:53 PM  Result Value Ref Range Status   Specimen Description URINE, CLEAN CATCH  Final   Special Requests NONE  Final   Culture >=100,000 COLONIES/mL ENTEROBACTER AEROGENES (A)  Final   Report Status 03/26/2016 FINAL  Final   Organism ID, Bacteria ENTEROBACTER AEROGENES (A)  Final      Susceptibility   Enterobacter aerogenes - MIC*    CEFAZOLIN >=64 RESISTANT Resistant     CEFTRIAXONE 16 INTERMEDIATE Intermediate     CIPROFLOXACIN <=0.25 SENSITIVE Sensitive     GENTAMICIN <=1 SENSITIVE Sensitive     IMIPENEM 1 SENSITIVE Sensitive     NITROFURANTOIN 32 SENSITIVE Sensitive     TRIMETH/SULFA <=20 SENSITIVE Sensitive     PIP/TAZO 16 SENSITIVE Sensitive     * >=100,000 COLONIES/mL ENTEROBACTER AEROGENES    [x]  Treated with cephalexin, organism resistant to prescribed antimicrobial  New antibiotic prescription: Stop cephalexin. Take Bactrim 1 DS tablet PO BID x 10 days  ED Provider: Alveta HeimlichStevi Barrett, PA-C  Jammy Stlouis L. Roseanne RenoStewart, PharmD PGY2 Infectious Diseases Pharmacy Resident Pager: [phone] 03/27/2016 10:01 AM

## 2016-03-27 NOTE — ED Notes (Signed)
Post ED Visit - Positive Culture Follow-up: Unsuccessful Patient Follow-up  Culture assessed and recommendations reviewed by: []  Enzo BiNathan Batchelder, Pharm.D. []  Celedonio MiyamotoJeremy Frens, Pharm.D., BCPS []  Garvin FilaMike Maccia, Pharm.D. []  Georgina PillionElizabeth Martin, Pharm.D., BCPS []  [place] HouseMinh Pham, [address]Pharm.D., BCPS, AAHIVP []  Estella HuskMichelle Turner, Pharm.D., BCPS, AAHIVP [x]  Tennis Mustassie Stewart, Pharm.D. []  Sherle Poeob Vincent, [address]Ph[address]  Positive urine culture  []  Patient discharged without antimicrobial prescription and treatment is now indicated [x]  Organism is resistant to prescribed ED discharge antimicrobial []  Patient with positive blood cultures   Unable to contact patient after 3 attempts, letter will be sent to address on file  Lysle PearlRobertson, Brennen Gardiner Talley 03/27/2016, 10:26 AM

## 2016-04-03 ENCOUNTER — Telehealth (HOSPITAL_BASED_OUTPATIENT_CLINIC_OR_DEPARTMENT_OTHER): Payer: Self-pay | Admitting: Emergency Medicine

## 2016-04-03 NOTE — Telephone Encounter (Signed)
Post ED Visit - Positive Culture Follow-up: Successful Patient Follow-Up  Culture assessed and recommendations reviewed by: []  Enzo BiNathan Batchelder, Pharm.D. []  Celedonio MiyamotoJeremy Frens, [address]Pharm[address]D., BCPS []  Garvin FilaMike Maccia, Pharm.D. []  Georgina PillionElizabeth Martin, [address]Ph[address]D., BCPS []  [place]h Pham, [address]Pharm[address]D., BCPS, AAHIVP []  Estella HuskMichelle Turner, Pharm.D., BCPS, AAHIVP []  Tennis Mustassie Stewart, Pharm.D. []  Sherle Poeob Vincent, [address]Ph[address]  Positive urine culture  []  Patient discharged without antimicrobial prescription and treatment is now indicated [x]  Organism is resistant to prescribed ED discharge antimicrobial []  Patient with positive blood cultures  Changes discussed with ED provider: Alveta HeimlichStevi Barrett PA New antibiotic prescription stop Cephalexin , start Bactrim DS tablet 1 po bid x 10 days [facility]  Contacted patient, 04/03/16 0906   Berle MullMiller, Abagail Limb 04/03/2016, 10:04 AM

## 2016-10-02 ENCOUNTER — Emergency Department (HOSPITAL_COMMUNITY): Payer: Medicare Other

## 2016-10-02 ENCOUNTER — Encounter (HOSPITAL_COMMUNITY): Payer: Self-pay | Admitting: Emergency Medicine

## 2016-10-02 ENCOUNTER — Emergency Department (HOSPITAL_COMMUNITY)
Admission: EM | Admit: 2016-10-02 | Discharge: 2016-10-02 | Disposition: A | Discharge status: Home / Self Care | Payer: Medicare Other | Attending: Emergency Medicine | Admitting: Emergency Medicine

## 2016-10-02 DIAGNOSIS — M1612 Unilateral primary osteoarthritis, left hip: Secondary | ICD-10-CM | POA: Insufficient documentation

## 2016-10-02 DIAGNOSIS — Z7982 Long term (current) use of aspirin: Secondary | ICD-10-CM | POA: Diagnosis not present

## 2016-10-02 DIAGNOSIS — M25511 Pain in right shoulder: Secondary | ICD-10-CM | POA: Diagnosis present

## 2016-10-02 DIAGNOSIS — Z79899 Other long term (current) drug therapy: Secondary | ICD-10-CM | POA: Diagnosis not present

## 2016-10-02 DIAGNOSIS — F17210 Nicotine dependence, cigarettes, uncomplicated: Secondary | ICD-10-CM | POA: Insufficient documentation

## 2016-10-02 DIAGNOSIS — I10 Essential (primary) hypertension: Secondary | ICD-10-CM | POA: Insufficient documentation

## 2016-10-02 MED ORDER — NAPROXEN 500 MG PO TABS: 500.0000 mg | ORAL_TABLET | Freq: Two times a day (BID) | ORAL | 0 refills | Status: DC

## 2016-10-02 MED ORDER — IBUPROFEN 400 MG PO TABS
600.0000 mg | ORAL_TABLET | Freq: Once | ORAL | Status: AC
Start: 2016-10-02 — End: 2016-10-02
  Administered 2016-10-02: 600 mg via ORAL
  Filled 2016-10-02: qty 2

## 2016-10-02 NOTE — ED Provider Notes (Signed)
[facility]-EMERGENCY DEPT Provider Note   CSN: [id number] Arrival date & time: 10/02/16  1245     History   Chief Complaint Chief Complaint  Patient presents with  . Shoulder Pain    HPI Dennis Ruiz is a 75 y.o. male.  HPI  Dennis Ruiz is a 75 y.o. male who presents to the Emergency Department complaining of right shoulder and left hip pain for three days.  He states that he was trying to prevent a bale of hay from falling from a truck and felt a pulling sensation to his shoulder.  Pain to the hip has been present since that time as well.  Shoulder with worse with raising his arm.  He has not tried any therapies.  Pain improves at rest.  He denies swelling, chest pain, shortness of breath, numbness or weakness or neck pain.  Also denies back pain, numbness or weakness of the left leg.     Past Medical History:  Diagnosis Date  . Back pain   . Chest pain   . Hypertension     Patient Active Problem List   Diagnosis Date Noted  . Facial cellulitis 03/01/2012  . Benign hypertension 03/01/2012  . BACK PAIN 04/17/2009  . CHEST PAIN 04/17/2009    Past Surgical History:  Procedure Laterality Date  . HERNIA REPAIR    . TRANSURETHRAL RESECTION OF PROSTATE         Home Medications    Prior to Admission medications   Medication Sig Start Date End Date Taking? Authorizing Provider  aspirin EC 81 MG tablet Take 81 mg by mouth every morning.    Historical Provider, MD  cephALEXin (KEFLEX) 500 MG capsule Take 1 capsule (500 mg total) by mouth 4 (four) times daily. 03/23/16   Mancel Bale, MD  Cholecalciferol (VITAMIN D PO) Take 2 tablets by mouth daily.    Historical Provider, MD  diazepam (VALIUM) 5 MG tablet Take 5 mg by mouth daily as needed for anxiety.  09/20/15   Historical Provider, MD  HYDROcodone-acetaminophen (NORCO) 7.5-325 MG tablet Take 0.5 tablets by mouth every 6 (six) hours as needed for moderate pain.     Historical Provider, MD  Iron-Vitamins (GERITOL COMPLETE  PO) Take 1 tablet by mouth every morning.    Historical Provider, MD  Multiple Vitamins-Minerals (ICAPS PO) Take 1 capsule by mouth daily.    Historical Provider, MD  temazepam (RESTORIL) 30 MG capsule Take 30 mg by mouth at bedtime as needed for sleep.  09/05/15   Historical Provider, MD  triamterene-hydrochlorothiazide (MAXZIDE-25) 37.5-25 MG per tablet Take 0.5 tablets by mouth daily. For high blood pressure    Historical Provider, MD  VITAMIN E PO Take 1 tablet by mouth every morning.    Historical Provider, MD    Family History Family History  Problem Relation Age of Onset  . Cancer Father     Social History Social History  Substance Use Topics  . Smoking status: Current Every Day Smoker    Packs/day: 1.00    Types: Cigarettes  . Smokeless tobacco: Not on file     Comment: pt reprots pack every two weeks.  . Alcohol use Yes     Comment: socail     Allergies   Patient has no known allergies.   Review of Systems Review of Systems  Constitutional: Negative for chills and fever.  Respiratory: Negative for shortness of breath.   Cardiovascular: Negative for chest pain.  Gastrointestinal: Negative for abdominal pain, nausea  and vomiting.  Genitourinary: Negative for dysuria and flank pain.  Musculoskeletal: Positive for arthralgias (right shoulder and left hip pain) and joint swelling. Negative for back pain and neck pain.  Skin: Negative for color change and wound.  Neurological: Negative for weakness and numbness.  All other systems reviewed and are negative.    Physical Exam Updated Vital Signs BP 153/91   Pulse 82   Temp 98.4 F (36.9 C) (Oral)   Resp 16   Ht 6\' 2"  (1.88 m)   Wt 90.7 kg   SpO2 99%   BMI 25.68 kg/m   Physical Exam  Constitutional: He appears well-developed and well-nourished. No distress.  HENT:  Head: Atraumatic.  Mouth/Throat: Oropharynx is clear and moist.  Neck: Normal range of motion. Neck supple.  Cardiovascular: Normal rate,  regular rhythm and intact distal pulses.   Pulmonary/Chest: Effort normal and breath sounds normal. No respiratory distress.  Musculoskeletal: He exhibits tenderness. He exhibits no edema or deformity.  ttp anterior right shoulder and along AC joint.  Pain on ROM of the left hip.  No rash, erythema.  5/5 strength against resistance of bilateral LE's.    Neurological: He is alert.  Nursing note and vitals reviewed.    ED Treatments / Results  Labs (all labs ordered are listed, but only abnormal results are displayed) Labs Reviewed - No data to display  EKG  EKG Interpretation None       Radiology Dg Shoulder Right  Result Date: 10/02/2016 CLINICAL DATA:  Fall.  Right shoulder pain.  Initial encounter. EXAM: RIGHT SHOULDER - 2+ VIEW COMPARISON:  None. FINDINGS: There is no evidence of acute fracture or dislocation. Bone mineralization is normal. Mild degenerative spurring is noted at the acromioclavicular joint. No soft tissue abnormality is seen. IMPRESSION: No evidence of acute osseous abnormality. Mild AC joint osteoarthrosis. Electronically Signed   By: Sebastian AcheAllen  Grady M.D.   On: 10/02/2016 13:42   Dg Hip Unilat W Or Wo Pelvis 2-3 Views Left  Result Date: 10/02/2016 CLINICAL DATA:  Initial evaluation for acute left groin pain status post fall. EXAM: DG HIP (WITH OR WITHOUT PELVIS) 2-3V LEFT COMPARISON:  None. FINDINGS: No acute fracture or dislocation. Femoral heads in normal alignment within the acetabula. Visualized bony pelvis intact. SI joints approximated. Advanced degenerative osteoarthrosis noted about the hips bilaterally, left worse than right. Degenerative changes noted within the lower lumbar spine. No soft tissue abnormality. IMPRESSION: 1. No acute fracture or dislocation. 2. Advanced degenerative osteoarthrosis about the hips bilaterally, left worse than right. 3. 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 Electronically Signed   By: Rise MuBenjamin  McClintock M.D.    On: 10/02/2016 13:43    Procedures Procedures (including critical care time)  Medications Ordered in ED Medications  ibuprofen (ADVIL,MOTRIN) tablet 600 mg (not administered)     Initial Impression / Assessment and Plan / ED Course  I have reviewed the triage vital signs and the nursing notes.  Pertinent labs & imaging results that were available during my care of the patient were reviewed by me and considered in my medical decision making (see chart for details).  Clinical Course     Pt NV intact.  Ambulates with steady gait.  No fall.  Pt appears stable for d/c, agrees to symptomatic tx with NSAID and close orthopedic f/u if not improving.  Possible rotator cuff injury also discussed. No concerning sx's for septic joint.  Pt seen by Dr.Mesnar and care plan discussed.  Final Clinical Impressions(s) /  ED Diagnoses   Final diagnoses:  Acute pain of right shoulder  Osteoarthritis of left hip, unspecified osteoarthritis type    New Prescriptions New Prescriptions   No medications on file     Rosey Bathammy Olivene Cookston, PA-C 10/04/16 2232    Marily MemosJason Mesner, MD 10/07/16 [phone]

## 2016-10-02 NOTE — ED Provider Notes (Signed)
Medical screening examination/treatment/procedure(s) were conducted as a shared visit with non-physician practitioner(s) and myself.  I personally evaluated the patient during the encounter.  Right shoulder pain after trying to catch a bail of hay. On exam has significant pain to left his right arm above 90 degrees concernign for rotator cuff. No e/o fracture. Plan for conservative care and ortho follow up.    Marily MemosJason Mandee Pluta, MD 10/07/16 [phone]

## 2016-10-02 NOTE — ED Triage Notes (Signed)
Pt was working outside on Monday, since then right shoulder pain and left hip pain.

## 2016-10-02 NOTE — Discharge Instructions (Signed)
Avoid excessive use of the right shoulder.  Call Dr. Mort SawyersHarrison's office to arrange a follow-up appt in a week if the pain is not improving

## 2017-07-14 ENCOUNTER — Encounter (HOSPITAL_COMMUNITY): Payer: Self-pay | Admitting: Emergency Medicine

## 2017-07-14 ENCOUNTER — Emergency Department (HOSPITAL_COMMUNITY)
Admission: EM | Admit: 2017-07-14 | Discharge: 2017-07-14 | Disposition: A | Discharge status: Home / Self Care | Payer: Medicare Other | Attending: Emergency Medicine | Admitting: Emergency Medicine

## 2017-07-14 DIAGNOSIS — I10 Essential (primary) hypertension: Secondary | ICD-10-CM | POA: Insufficient documentation

## 2017-07-14 DIAGNOSIS — F17210 Nicotine dependence, cigarettes, uncomplicated: Secondary | ICD-10-CM | POA: Diagnosis not present

## 2017-07-14 DIAGNOSIS — Z79899 Other long term (current) drug therapy: Secondary | ICD-10-CM | POA: Insufficient documentation

## 2017-07-14 DIAGNOSIS — R5381 Other malaise: Secondary | ICD-10-CM

## 2017-07-14 DIAGNOSIS — Z7982 Long term (current) use of aspirin: Secondary | ICD-10-CM | POA: Diagnosis not present

## 2017-07-14 DIAGNOSIS — R5383 Other fatigue: Secondary | ICD-10-CM | POA: Insufficient documentation

## 2017-07-14 LAB — COMPREHENSIVE METABOLIC PANEL
ALT: 18 U/L (ref 17–63)
AST: 25 U/L (ref 15–41)
Albumin: 4.3 g/dL (ref 3.5–5.0)
Alkaline Phosphatase: 57 U/L (ref 38–126)
Anion gap: 9 (ref 5–15)
BILIRUBIN TOTAL: 0.4 mg/dL (ref 0.3–1.2)
BUN: 16 mg/dL (ref 6–20)
CO2: 27 mmol/L (ref 22–32)
CREATININE: 1.06 mg/dL (ref 0.61–1.24)
Calcium: 9 mg/dL (ref 8.9–10.3)
Chloride: 101 mmol/L (ref 101–111)
Glucose, Bld: 97 mg/dL (ref 65–99)
POTASSIUM: 3.9 mmol/L (ref 3.5–5.1)
Sodium: 137 mmol/L (ref 135–145)
TOTAL PROTEIN: 7.7 g/dL (ref 6.5–8.1)

## 2017-07-14 LAB — CBC WITH DIFFERENTIAL/PLATELET
BASOS ABS: 0 10*3/uL (ref 0.0–0.1)
BASOS PCT: 0 %
Eosinophils Absolute: 0.2 10*3/uL (ref 0.0–0.7)
Eosinophils Relative: 4 %
HCT: 39.4 % (ref 39.0–52.0)
Hemoglobin: 13.5 g/dL (ref 13.0–17.0)
Lymphocytes Relative: 49 %
Lymphs Abs: 2.5 10*3/uL (ref 0.7–4.0)
MCH: 31.8 pg (ref 26.0–34.0)
MCHC: 34.3 g/dL (ref 30.0–36.0)
MCV: 92.9 fL (ref 78.0–100.0)
MONO ABS: 0.4 10*3/uL (ref 0.1–1.0)
Monocytes Relative: 8 %
Neutro Abs: 2 10*3/uL (ref 1.7–7.7)
Neutrophils Relative %: 39 %
Platelets: 145 10*3/uL — ABNORMAL LOW (ref 150–400)
RBC: 4.24 MIL/uL (ref 4.22–5.81)
RDW: 13.1 % (ref 11.5–15.5)
WBC: 5.1 10*3/uL (ref 4.0–10.5)

## 2017-07-14 LAB — URINALYSIS, ROUTINE W REFLEX MICROSCOPIC
Bilirubin Urine: NEGATIVE
Glucose, UA: NEGATIVE mg/dL
Hgb urine dipstick: NEGATIVE
KETONES UR: NEGATIVE mg/dL
LEUKOCYTES UA: NEGATIVE
NITRITE: NEGATIVE
PROTEIN: NEGATIVE mg/dL
Specific Gravity, Urine: 1.01 (ref 1.005–1.030)
pH: 7 (ref 5.0–8.0)

## 2017-07-14 LAB — TROPONIN I

## 2017-07-14 MED ORDER — SODIUM CHLORIDE 0.9 % IV BOLUS (SEPSIS)
1000.0000 mL | Freq: Once | INTRAVENOUS | Status: AC
  Administered 2017-07-14: 1000 mL via INTRAVENOUS

## 2017-07-14 NOTE — ED Provider Notes (Signed)
[facility] Provider Note   CSN: [id number] Arrival date & time: 07/14/17  0041     History   Chief Complaint Chief Complaint  Patient presents with  . Fatigue    HPI Dennis Ruiz is a 76 y.o. male.  Patient is a 76 year old male with past medical history of hypertension. He presents today for evaluation of weakness and generalized malaise. This started this afternoon while he was driving home from [place]. He reports generalized aches and pains, but no focal discomfort. He denies any chest pain, difficulty breathing, nausea, or dizziness. He tells me that he just doesn't quite feel right, but has difficulty elaborating further. He believes his blood pressure may be elevated, and is also concerned he may be dehydrated (but denies any vomiting or diarrhea or decreased PO intake).   The history is provided by the patient.  Weakness  Primary symptoms comment: generalized weakness. This is a new problem. The current episode started 6 to 12 hours ago. The problem has been gradually worsening. There was no focality noted. There has been no fever. Pertinent negatives include no shortness of breath, no chest pain, no vomiting, no altered mental status, no confusion and no headaches.    Past Medical History:  Diagnosis Date  . Back pain   . Chest pain   . Hypertension     Patient Active Problem List   Diagnosis Date Noted  . Facial cellulitis 03/01/2012  . Benign hypertension 03/01/2012  . BACK PAIN 04/17/2009  . CHEST PAIN 04/17/2009    Past Surgical History:  Procedure Laterality Date  . HERNIA REPAIR    . TRANSURETHRAL RESECTION OF PROSTATE         Home Medications    Prior to Admission medications   Medication Sig Start Date End Date Taking? Authorizing Provider  aspirin EC 81 MG tablet Take 81 mg by mouth every morning.   Yes [provider]  Cholecalciferol (VITAMIN D PO) Take 2 tablets by mouth daily.   Yes [provider]    diazepam (VALIUM) 5 MG tablet Take 5 mg by mouth daily as needed for anxiety.  09/20/15  Yes [provider]  HYDROcodone-acetaminophen (NORCO) 7.5-325 MG tablet Take 0.5 tablets by mouth every 6 (six) hours as needed for moderate pain.    Yes [provider]  Iron-Vitamins (GERITOL COMPLETE PO) Take 1 tablet by mouth every morning.   Yes [provider]  Multiple Vitamins-Minerals (ICAPS PO) Take 1 capsule by mouth daily.   Yes [provider]  temazepam (RESTORIL) 30 MG capsule Take 30 mg by mouth at bedtime as needed for sleep.  09/05/15  Yes [provider]  triamterene-hydrochlorothiazide (MAXZIDE-25) 37.5-25 MG per tablet Take 0.5 tablets by mouth daily. For high blood pressure   Yes [provider]  VITAMIN E PO Take 1 tablet by mouth every morning.   Yes [provider]  cephALEXin (KEFLEX) 500 MG capsule Take 1 capsule (500 mg total) by mouth 4 (four) times daily. 03/23/16   Mancel Bale, MD  naproxen (NAPROSYN) 500 MG tablet Take 1 tablet (500 mg total) by mouth 2 (two) times daily with a meal. 10/02/16   Triplett, Tammy, PA-C    Family History Family History  Problem Relation Age of Onset  . Cancer Father     Social History Social History  Substance Use Topics  . Smoking status: Current Every Day Smoker    Packs/day: 1.00    Types: Cigarettes  .  Smokeless tobacco: Never Used     Comment: pt reprots pack every two weeks.  . Alcohol use Yes     Comment: socail     Allergies   Patient has no known allergies.   Review of Systems Review of Systems  Respiratory: Negative for shortness of breath.   Cardiovascular: Negative for chest pain.  Gastrointestinal: Negative for vomiting.  Neurological: Positive for weakness. Negative for headaches.  Psychiatric/Behavioral: Negative for confusion.  All other systems reviewed and are negative.    Physical Exam Updated Vital Signs BP 120/86   Pulse 62   Temp (!)  97.5 F (36.4 C) (Oral)   Resp 20   Ht  (1.88 m)   Wt 81.6 kg (180 lb)   SpO2 100%   BMI 23.11 kg/m   Physical Exam  Constitutional: He is oriented to person, place, and time. He appears well-developed and well-nourished. No distress.  HENT:  Head: Normocephalic and atraumatic.  Mouth/Throat: Oropharynx is clear and moist.  Eyes: Pupils are equal, round, and reactive to light. EOM are normal.  Neck: Normal range of motion. Neck supple.  Cardiovascular: Normal rate and regular rhythm.  Exam reveals no friction rub.   No murmur heard. Pulmonary/Chest: Effort normal and breath sounds normal. No respiratory distress. He has no wheezes. He has no rales.  Abdominal: Soft. Bowel sounds are normal. He exhibits no distension. There is no tenderness.  Musculoskeletal: Normal range of motion. He exhibits no edema.  Neurological: He is alert and oriented to person, place, and time. No cranial nerve deficit. He exhibits normal muscle tone. Coordination normal.  Skin: Skin is warm and dry. He is not diaphoretic.  Nursing note and vitals reviewed.    ED Treatments / Results  Labs (all labs ordered are listed, but only abnormal results are displayed) Labs Reviewed  COMPREHENSIVE METABOLIC PANEL  TROPONIN I  CBC WITH DIFFERENTIAL/PLATELET  URINALYSIS, ROUTINE W REFLEX MICROSCOPIC    EKG  EKG Interpretation None       Radiology No results found.  Procedures Procedures (including critical care time)  Medications Ordered in ED Medications  sodium chloride 0.9 % bolus 1,000 mL (not administered)     Initial Impression / Assessment and Plan / ED Course  I have reviewed the triage vital signs and the nursing notes.  Pertinent labs & imaging results that were available during my care of the patient were reviewed by me and considered in my medical decision making (see chart for details).  Patient presents with generalized malaise and not feeling well. This started acutely  while he was driving home from [place]. He has no real specific symptoms. His workup is unremarkable. He has no leukocytosis, no anemia, and no electrolyte abnormality. His EKG is normal and troponin is negative. I am uncertain as to the exact etiology of this patient's symptoms, however nothing appears emergent. It is possible that there may be some viral component. I see no indication for admission and will have the patient return if he experiences additional problems.  Final Clinical Impressions(s) / ED Diagnoses   Final diagnoses:  None    New Prescriptions New Prescriptions   No medications on file     Geoffery Lyons, MD 07/14/17 [phone]

## 2017-07-14 NOTE — ED Triage Notes (Signed)
Pt c/o overall weakness, chills, body aches, headache, abd pain, back pain, and high blood pressure that started Monday morning.

## 2017-07-14 NOTE — Discharge Instructions (Signed)
Continue medications as previously prescribed.  Return to the emergency department if you develop chest pain, difficulty breathing, high fevers, or other new and concerning symptoms.

## 2018-01-06 ENCOUNTER — Emergency Department (HOSPITAL_COMMUNITY)
Admission: EM | Admit: 2018-01-06 | Discharge: 2018-01-06 | Discharge status: Absent without leave | Payer: Medicare Other

## 2020-04-13 ENCOUNTER — Emergency Department (HOSPITAL_COMMUNITY)
Admission: EM | Admit: 2020-04-13 | Discharge: 2020-04-13 | Disposition: A | Discharge status: Home / Self Care | Payer: Medicare Other | Attending: Emergency Medicine | Admitting: Emergency Medicine

## 2020-04-13 ENCOUNTER — Other Ambulatory Visit: Payer: Self-pay

## 2020-04-13 ENCOUNTER — Emergency Department (HOSPITAL_COMMUNITY): Payer: Medicare Other

## 2020-04-13 ENCOUNTER — Encounter (HOSPITAL_COMMUNITY): Payer: Self-pay | Admitting: *Deleted

## 2020-04-13 DIAGNOSIS — I10 Essential (primary) hypertension: Secondary | ICD-10-CM | POA: Insufficient documentation

## 2020-04-13 DIAGNOSIS — Z79899 Other long term (current) drug therapy: Secondary | ICD-10-CM | POA: Insufficient documentation

## 2020-04-13 DIAGNOSIS — F17210 Nicotine dependence, cigarettes, uncomplicated: Secondary | ICD-10-CM | POA: Diagnosis not present

## 2020-04-13 DIAGNOSIS — R05 Cough: Secondary | ICD-10-CM | POA: Diagnosis not present

## 2020-04-13 DIAGNOSIS — R638 Other symptoms and signs concerning food and fluid intake: Secondary | ICD-10-CM | POA: Diagnosis not present

## 2020-04-13 DIAGNOSIS — R531 Weakness: Secondary | ICD-10-CM | POA: Diagnosis not present

## 2020-04-13 DIAGNOSIS — M25511 Pain in right shoulder: Secondary | ICD-10-CM | POA: Diagnosis not present

## 2020-04-13 DIAGNOSIS — R109 Unspecified abdominal pain: Secondary | ICD-10-CM | POA: Diagnosis not present

## 2020-04-13 DIAGNOSIS — Z7982 Long term (current) use of aspirin: Secondary | ICD-10-CM | POA: Diagnosis not present

## 2020-04-13 LAB — CBC WITH DIFFERENTIAL/PLATELET
Abs Immature Granulocytes: 0.01 10*3/uL (ref 0.00–0.07)
Basophils Absolute: 0 10*3/uL (ref 0.0–0.1)
Basophils Relative: 1 %
Eosinophils Absolute: 0.2 10*3/uL (ref 0.0–0.5)
Eosinophils Relative: 5 %
HCT: 43 % (ref 39.0–52.0)
Hemoglobin: 14.1 g/dL (ref 13.0–17.0)
Immature Granulocytes: 0 %
Lymphocytes Relative: 47 %
Lymphs Abs: 1.8 10*3/uL (ref 0.7–4.0)
MCH: 31.3 pg (ref 26.0–34.0)
MCHC: 32.8 g/dL (ref 30.0–36.0)
MCV: 95.6 fL (ref 80.0–100.0)
Monocytes Absolute: 0.5 10*3/uL (ref 0.1–1.0)
Monocytes Relative: 15 %
Neutro Abs: 1.2 10*3/uL — ABNORMAL LOW (ref 1.7–7.7)
Neutrophils Relative %: 32 %
Platelets: 161 10*3/uL (ref 150–400)
RBC: 4.5 MIL/uL (ref 4.22–5.81)
RDW: 12.4 % (ref 11.5–15.5)
WBC: 3.7 10*3/uL — ABNORMAL LOW (ref 4.0–10.5)
nRBC: 0 % (ref 0.0–0.2)

## 2020-04-13 LAB — COMPREHENSIVE METABOLIC PANEL
ALT: 17 U/L (ref 0–44)
AST: 21 U/L (ref 15–41)
Albumin: 4.7 g/dL (ref 3.5–5.0)
Alkaline Phosphatase: 72 U/L (ref 38–126)
Anion gap: 11 (ref 5–15)
BUN: 22 mg/dL (ref 8–23)
CO2: 29 mmol/L (ref 22–32)
Calcium: 9.5 mg/dL (ref 8.9–10.3)
Chloride: 96 mmol/L — ABNORMAL LOW (ref 98–111)
Creatinine, Ser: 1.4 mg/dL — ABNORMAL HIGH (ref 0.61–1.24)
GFR calc Af Amer: 55 mL/min — ABNORMAL LOW (ref >60)
GFR calc non Af Amer: 47 mL/min — ABNORMAL LOW (ref >60)
Glucose, Bld: 93 mg/dL (ref 70–99)
Potassium: 4.5 mmol/L (ref 3.5–5.1)
Sodium: 136 mmol/L (ref 135–145)
Total Bilirubin: 0.9 mg/dL (ref 0.3–1.2)
Total Protein: 8.3 g/dL — ABNORMAL HIGH (ref 6.5–8.1)

## 2020-04-13 LAB — URINALYSIS, ROUTINE W REFLEX MICROSCOPIC
Bilirubin Urine: NEGATIVE
Glucose, UA: NEGATIVE mg/dL
Hgb urine dipstick: NEGATIVE
Ketones, ur: NEGATIVE mg/dL
Leukocytes,Ua: NEGATIVE
Nitrite: NEGATIVE
Protein, ur: NEGATIVE mg/dL
Specific Gravity, Urine: 1.011 (ref 1.005–1.030)
pH: 7 (ref 5.0–8.0)

## 2020-04-13 MED ORDER — SODIUM CHLORIDE 0.9 % IV BOLUS
1000.0000 mL | Freq: Once | INTRAVENOUS | Status: AC
  Administered 2020-04-13: 1000 mL via INTRAVENOUS

## 2020-04-13 NOTE — ED Provider Notes (Signed)
[facility] Provider Note   CSN: [id number] Arrival date & time: 04/13/20  1031     History Chief Complaint  Patient presents with  . Weakness    Dennis Ruiz is a 79 y.o. male.  79 year old male presents with multiple complaints, states that he has recently relocated to this area from [place] to care for his paraplegic brother.  Patient states that he has a chest cold, loss of appetite, unintentional weight loss, a sour stomach, right shoulder pain, dry mouth, his eyes appeared dim, and generalized weakness. Symptoms have been ongoing for an undetermined amount of time however has not been able to seek care until now as his brother has been admitted to the hospital. Denies blood in stools, history of diabetes, chest pain or difficulty breathing, recent injury to the shoulder.         Past Medical History:  Diagnosis Date  . Back pain   . Chest pain   . Hypertension     Patient Active Problem List   Diagnosis Date Noted  . Facial cellulitis 03/01/2012  . Benign hypertension 03/01/2012  . BACK PAIN 04/17/2009  . CHEST PAIN 04/17/2009    Past Surgical History:  Procedure Laterality Date  . HERNIA REPAIR    . TRANSURETHRAL RESECTION OF PROSTATE         Family History  Problem Relation Age of Onset  . Cancer Father     Social History   Tobacco Use  . Smoking status: Current Every Day Smoker    Packs/day: 1.00    Types: Cigarettes  . Smokeless tobacco: Never Used  . Tobacco comment: pt reprots pack every two weeks.  Substance Use Topics  . Alcohol use: Yes    Comment: socail  . Drug use: No    Home Medications Prior to Admission medications   Medication Sig Start Date End Date Taking? Authorizing Provider  aspirin EC 81 MG tablet Take 81 mg by mouth every morning.   Yes [provider]  Cholecalciferol (VITAMIN D PO) Take 2 tablets by mouth daily.   Yes [provider]  HYDROcodone-acetaminophen (NORCO) 7.5-325  MG tablet Take 0.5 tablets by mouth every 6 (six) hours as needed for moderate pain.    Yes [provider]  Iron-Vitamins (GERITOL COMPLETE PO) Take 1 tablet by mouth every morning.   Yes [provider]  Multiple Vitamins-Minerals (ICAPS PO) Take 1 capsule by mouth daily.   Yes [provider]  naproxen (NAPROSYN) 500 MG tablet Take 1 tablet (500 mg total) by mouth 2 (two) times daily with a meal. 10/02/16  Yes Triplett, Tammy, PA-C  temazepam (RESTORIL) 30 MG capsule Take 30 mg by mouth at bedtime as needed for sleep.  09/05/15  Yes [provider]  triamterene-hydrochlorothiazide (MAXZIDE-25) 37.5-25 MG per tablet Take 0.5 tablets by mouth daily. For high blood pressure   Yes [provider]  VITAMIN E PO Take 1 tablet by mouth every morning.   Yes [provider]  cephALEXin (KEFLEX) 500 MG capsule Take 1 capsule (500 mg total) by mouth 4 (four) times daily. 03/23/16   Daleen Bo, MD  diazepam (VALIUM) 5 MG tablet Take 5 mg by mouth daily as needed for anxiety.  09/20/15   [provider]    Allergies    Patient has no known allergies.  Review of Systems   Review of Systems  Constitutional: Positive for appetite change and unexpected weight change. Negative for diaphoresis and fever.  Respiratory: Positive for cough. Negative for shortness of breath.   Cardiovascular: Negative for chest pain.  Gastrointestinal: Positive for abdominal pain. Negative for anal bleeding, blood in stool, constipation, diarrhea, nausea and vomiting.  Endocrine: Negative for polydipsia, polyphagia and polyuria.  Genitourinary: Negative for difficulty urinating and dysuria.  Musculoskeletal: Positive for arthralgias.  Skin: Negative for rash and wound.  Neurological: Positive for weakness.  Hematological: Negative for adenopathy.  Psychiatric/Behavioral: Negative for confusion.  All other systems reviewed and are negative.   Physical  Exam Updated Vital Signs BP 128/78   Pulse 67   Temp 97.9 F (36.6 C) (Oral)   Resp 18   Ht 6\' 2"  (1.88 m)   Wt 81.6 kg   SpO2 100%   BMI 23.11 kg/m   Physical Exam Vitals and nursing note reviewed.  Constitutional:      General: He is not in acute distress.    Appearance: He is well-developed. He is not diaphoretic.  HENT:     Head: Normocephalic and atraumatic.     Mouth/Throat:     Mouth: Mucous membranes are dry.  Cardiovascular:     Rate and Rhythm: Normal rate and regular rhythm.     Pulses: Normal pulses.     Heart sounds: Normal heart sounds.  Pulmonary:     Effort: Pulmonary effort is normal.     Breath sounds: Normal breath sounds.  Abdominal:     Palpations: Abdomen is soft.     Tenderness: There is no abdominal tenderness.  Musculoskeletal:     Right lower leg: No edema.     Left lower leg: No edema.  Skin:    General: Skin is warm and dry.     Findings: No erythema or rash.  Neurological:     Mental Status: He is alert and oriented to person, place, and time.     Sensory: No sensory deficit.     Motor: No weakness.  Psychiatric:        Behavior: Behavior normal.     ED Results / Procedures / Treatments   Labs (all labs ordered are listed, but only abnormal results are displayed) Labs Reviewed  COMPREHENSIVE METABOLIC PANEL - Abnormal; Notable for the following components:      Result Value   Chloride 96 (*)    Creatinine, Ser 1.40 (*)    Total Protein 8.3 (*)    GFR calc non Af Amer 47 (*)    GFR calc Af Amer 55 (*)    All other components within normal limits  CBC WITH DIFFERENTIAL/PLATELET - Abnormal; Notable for the following components:   WBC 3.7 (*)    Neutro Abs 1.2 (*)    All other components within normal limits  URINALYSIS, ROUTINE W REFLEX MICROSCOPIC    EKG EKG Interpretation  Date/Time:  Friday April 13 2020 10:55:54 EDT Ventricular Rate:  100 PR Interval:  130 QRS Duration: 72 QT Interval:  332 QTC Calculation: 428 R  Axis:   76 Text Interpretation: Normal sinus rhythm Normal ECG since last tracing no significant change Confirmed by 09-07-1974 [phone]) on 04/13/2020 2:07:50 PM   Radiology DG Chest 2 View  Result Date: 04/13/2020 CLINICAL DATA:  Right shoulder pain, dry mouth, insomnia EXAM: CHEST - 2 VIEW COMPARISON:  01/08/2012 FINDINGS: There is no focal consolidation. There is no pleural effusion or pneumothorax. The heart and mediastinal contours are unremarkable. There is no acute osseous abnormality. IMPRESSION: No active cardiopulmonary disease. Electronically Signed   By: 01/10/2012  Patel   On: 04/13/2020 14:02    Procedures Procedures (including critical care time)  Medications Ordered in ED Medications  sodium chloride 0.9 % bolus 1,000 mL (1,000 mLs Intravenous New Bag/Given 04/13/20 1221)    ED Course  I have reviewed the triage vital signs and the nursing notes.  Pertinent labs & imaging results that were available during my care of the patient were reviewed by me and considered in my medical decision making (see chart for details).  Clinical Course as of Apr 13 1434  Fri Apr 13, 2020  4735 79 year old male with various complaints as listed above.  None of these complaints are acute today so much is ongoing and has been unable to seek care since relocating to this area due to the care he is providing to his brother who is currently hospitalized.  Screening lab work without significant findings, he does have a slightly decreased white count at 3.7, his CMP has elevated creatinine at 1.4, urinalysis is unremarkable.  Chest x-ray is unremarkable as well.  Discussed with patient the importance of following up with primary care, will give referral for local PCP.  Advised patient he likely will need follow-up with orthopedics regarding his shoulder limitations, may benefit from physical therapy.  Also due likely for colonoscopy which is important for follow-up due to his concern of unintentional  weight loss.  Patient verbalizes understanding of plan of care.   [LM]    Clinical Course User Index [LM] Alden Hipp   MDM Rules/Calculators/A&P                          Final Clinical Impression(s) / ED Diagnoses Final diagnoses:  Weakness    Rx / DC Orders ED Discharge Orders    None       Alden Hipp 04/13/20 1435    Mancel Bale, MD 04/13/20 [phone]

## 2020-04-13 NOTE — ED Triage Notes (Signed)
Patient comes to the ED with weakness, right shoulder pain, dry mouth and insomnia for one week.  Patient reports unable to eat due to no appetite.

## 2020-04-13 NOTE — ED Provider Notes (Signed)
  Face-to-face evaluation   History: He presents for several complaints including general weakness, decreased appetite, difficulty sleeping, and weight loss.  He also has significant limitation of use of right shoulder secondary to pain.  He does not have a local primary care doctor.  Physical exam: Awake, alert and cooperative.  No dysarthria or aphasia.  Right shoulder has decreased motion, and appears to be moderately frozen.  No other extremity deformities.  Abdomen soft and nontender.  Medical screening examination/treatment/procedure(s) were conducted as a shared visit with non-physician practitioner(s) and myself.  I personally evaluated the patient during the encounter    Mancel Bale, MD 04/13/20 [phone]

## 2020-04-13 NOTE — Discharge Instructions (Signed)
You need to establish care with a local primary care provider to help continue your work up. Please contact the PCP referral given.

## 2020-04-13 NOTE — ED Notes (Signed)
Patient transported to X-ray 

## 2020-05-08 ENCOUNTER — Ambulatory Visit (INDEPENDENT_AMBULATORY_CARE_PROVIDER_SITE_OTHER): Payer: Medicare Other | Admitting: Nurse Practitioner

## 2020-05-09 ENCOUNTER — Encounter (INDEPENDENT_AMBULATORY_CARE_PROVIDER_SITE_OTHER): Payer: Self-pay | Admitting: Nurse Practitioner

## 2020-05-09 ENCOUNTER — Ambulatory Visit (HOSPITAL_COMMUNITY)
Admission: RE | Admit: 2020-05-09 | Discharge: 2020-05-09 | Disposition: A | Discharge status: Home / Self Care | Payer: Medicare Other | Source: Ambulatory Visit | Attending: Nurse Practitioner | Admitting: Nurse Practitioner

## 2020-05-09 ENCOUNTER — Ambulatory Visit (INDEPENDENT_AMBULATORY_CARE_PROVIDER_SITE_OTHER): Payer: Medicare Other | Admitting: Nurse Practitioner

## 2020-05-09 ENCOUNTER — Other Ambulatory Visit: Payer: Self-pay

## 2020-05-09 VITALS — BP 136/89 | HR 87 | Temp 97.5°F | Resp 18 | Ht 74.0 in | Wt 178.0 lb

## 2020-05-09 DIAGNOSIS — M79675 Pain in left toe(s): Secondary | ICD-10-CM

## 2020-05-09 DIAGNOSIS — M25511 Pain in right shoulder: Secondary | ICD-10-CM | POA: Diagnosis not present

## 2020-05-09 DIAGNOSIS — R5383 Other fatigue: Secondary | ICD-10-CM | POA: Diagnosis not present

## 2020-05-09 DIAGNOSIS — Z131 Encounter for screening for diabetes mellitus: Secondary | ICD-10-CM | POA: Diagnosis not present

## 2020-05-09 DIAGNOSIS — R05 Cough: Secondary | ICD-10-CM

## 2020-05-09 DIAGNOSIS — M5442 Lumbago with sciatica, left side: Secondary | ICD-10-CM | POA: Diagnosis present

## 2020-05-09 DIAGNOSIS — G8929 Other chronic pain: Secondary | ICD-10-CM | POA: Insufficient documentation

## 2020-05-09 DIAGNOSIS — R059 Cough, unspecified: Secondary | ICD-10-CM

## 2020-05-09 DIAGNOSIS — M5441 Lumbago with sciatica, right side: Secondary | ICD-10-CM

## 2020-05-09 DIAGNOSIS — J029 Acute pharyngitis, unspecified: Secondary | ICD-10-CM

## 2020-05-09 DIAGNOSIS — R531 Weakness: Secondary | ICD-10-CM | POA: Diagnosis not present

## 2020-05-09 DIAGNOSIS — I10 Essential (primary) hypertension: Secondary | ICD-10-CM

## 2020-05-09 DIAGNOSIS — Z87898 Personal history of other specified conditions: Secondary | ICD-10-CM

## 2020-05-09 DIAGNOSIS — Z13220 Encounter for screening for lipoid disorders: Secondary | ICD-10-CM

## 2020-05-09 LAB — POCT RAPID STREP A: Strep A Ag: NEGATIVE

## 2020-05-09 NOTE — Progress Notes (Signed)
Subjective:  Patient ID: Dennis Ruiz, male    DOB: 01-28-1941  Age: 79 y.o. MRN: [id number]  CC:  Chief Complaint  Patient presents with  . New Patient (Initial Visit)    sore throat   . Back Pain      HPI  This patient arrives today to establish care at this office.  He has multiple complaints, and tells me he has not been to see a primary care doctor for some time.  He has been focused on taking care of a family member who has been sick over the last 6 to 7 months.  Today, he was to be evaluated for sore throat, intermittent numbness to the right leg, and left toe pain.  Sore throat: He tells me has had a sore throat for approximately 1 week.  He has had also had a reduced appetite, no change in smell or taste, has not been around any known sick contacts, has not experienced fever, has not experienced any rash.  He has had intermittent cough sometimes with sputum production but this is not very frequently.  Nothing seems to make the sore throat worse he has not tried to make anything to make it better.  Numbness: He has been experiencing intermittent numbness down his right and left legs but mostly to his right leg.  He notices it most when he is traveling or sitting for long periods of time.  This has been going on for approximately 7 to 6 months.  Sometimes he will experience cramps in his lower extremities.  He does have a history of chronic low back pain.  He also feels that he is been experiencing some progressive weakness.  He tells me walking around with help with the pain, he denies any claudication pain.  Left toe pain: He has been having some left toe pain and would like evaluation for this as well.  It seems to be mild and he does not have any history of gout.  He has not tried anything to make the pain better.  Wearing tight shoes makes the pain worse.   Past Medical History:  Diagnosis Date  . Back pain   . Chest pain   . Hypertension       Family History    Problem Relation Age of Onset  . Cancer Father     Social History   Social History Narrative   Widowed   No regular exercise   Social History   Tobacco Use  . Smoking status: Current Every Day Smoker    Packs/day: 1.00    Types: Cigarettes  . Smokeless tobacco: Never Used  . Tobacco comment: pt reprots pack every two weeks.  Substance Use Topics  . Alcohol use: Yes    Comment: socail     Current Meds  Medication Sig  . aspirin EC 81 MG tablet Take 81 mg by mouth every morning.  . Cholecalciferol (VITAMIN D PO) Take 2 tablets by mouth daily.  Marland Kitchen HYDROcodone-acetaminophen (NORCO) 7.5-325 MG tablet Take 0.5 tablets by mouth every 6 (six) hours as needed for moderate pain.   . Multiple Vitamins-Minerals (ICAPS PO) Take 1 capsule by mouth daily.  . temazepam (RESTORIL) 30 MG capsule Take 30 mg by mouth at bedtime as needed for sleep.   Marland Kitchen triamterene-hydrochlorothiazide (MAXZIDE-25) 37.5-25 MG per tablet Take 0.5 tablets by mouth daily. For high blood pressure  . VITAMIN E PO Take 1 tablet by mouth every morning.    ROS:  Review of Systems  Constitutional: Positive for malaise/fatigue.  See HPI.   Objective:   Today's Vitals: BP 136/89 (BP Location: Left Arm, Patient Position: Sitting, Cuff Size: Normal)   Pulse 87   Temp (!) 97.5 F (36.4 C) (Temporal)   Resp 18   Ht 6' 2"  (1.88 m)   Wt 178 lb (80.7 kg)   SpO2 98%   BMI 22.85 kg/m  Vitals with BMI 05/09/2020 04/13/2020 04/13/2020  Height 6' 2"  - -  Weight 178 lbs - -  BMI 39.76 - -  Systolic 734 193 790  Diastolic 89 75 78  Pulse 87 66 67     Physical Exam Vitals reviewed.  Constitutional:      Appearance: Normal appearance.  HENT:     Head: Normocephalic and atraumatic.     Right Ear: Hearing, tympanic membrane and ear canal normal.     Left Ear: Hearing, tympanic membrane and ear canal normal.     Mouth/Throat:     Mouth: Mucous membranes are dry.     Pharynx: Uvula midline.     Tonsils: No  tonsillar exudate.     Comments: POC strep a test negative Neck:     Thyroid: No thyromegaly.  Cardiovascular:     Rate and Rhythm: Normal rate and regular rhythm.  Pulmonary:     Effort: Pulmonary effort is normal.     Breath sounds: Normal breath sounds.  Musculoskeletal:     Right shoulder: Deformity present. Decreased range of motion.     Cervical back: Normal and neck supple.     Thoracic back: Normal. No bony tenderness.     Lumbar back: No bony tenderness. Positive right straight leg raise test. Negative left straight leg raise test.  Lymphadenopathy:     Cervical: No cervical adenopathy.  Skin:    General: Skin is warm and dry.  Neurological:     Mental Status: He is alert and oriented to person, place, and time.  Psychiatric:        Mood and Affect: Mood normal.        Behavior: Behavior normal.        Thought Content: Thought content normal.        Judgment: Judgment normal.          Assessment and Plan   1. Acute pain of right shoulder   2. Weakness   3. Fatigue, unspecified type   4. Screening for diabetes mellitus   5. Screening, lipid   6. Pain of toe of left foot   7. Sore throat   8. Cough   9. Chronic bilateral low back pain with bilateral sciatica   10. History of chest pain   11. Hypertension, unspecified type      Plan: On exam I did notice that he had a deformity of his right shoulder.  He did mention that he is having reduced range of motion and reduce strength to his right shoulder I am going refer him to orthopedic surgery for further evaluation.  We will collect blood work for further evaluation of his complaints of weakness, fatigue, and history of hypertension.  For now he will continue on his current medications as prescribed.  I will also send him for x-ray of his chest, left foot, and lumbar spine.  I did recommend physical therapy as I feel that his numbness to his legs represents sciatica, however he would like to wait for x-ray  results and blood work before deciding whether or  not he would like to go to physical therapy. POC strep test was negative in office today. I have recommend supportive therapy with cepacol lozenges as needed. He was told to call the office if symptoms persist or worsen over the next 7 days.  He tells me he understands.   Tests ordered Orders Placed This Encounter  Procedures  . DG Lumbar Spine Complete  . DG Foot Complete Left  . DG Chest 2 View  . CMP with eGFR([facility])  . CBC with Differential/Platelets  . TSH  . T3, Free  . T4, Free  . Uric Acid  . Lipid Panel  . Ambulatory referral to Orthopedic Surgery      No orders of the defined types were placed in this encounter.   Patient to follow-up in 1 month or sooner, pending test results  Ailene Ards, NP

## 2020-05-09 NOTE — Patient Instructions (Addendum)
Cepacol Lozenges - suck on one lozenge every 2 hours as needed for pain

## 2020-05-12 ENCOUNTER — Telehealth (INDEPENDENT_AMBULATORY_CARE_PROVIDER_SITE_OTHER): Payer: Self-pay | Admitting: Nurse Practitioner

## 2020-05-12 NOTE — Telephone Encounter (Signed)
Please call this patient and let him know that there were no significant acute abnormalities of his xrays. Please have him follow-up as scheduled.

## 2020-05-15 NOTE — Telephone Encounter (Signed)
Tried to reach him left a detailed voicemail

## 2020-06-07 ENCOUNTER — Ambulatory Visit: Payer: Medicare Other

## 2020-06-07 ENCOUNTER — Other Ambulatory Visit: Payer: Self-pay

## 2020-06-07 ENCOUNTER — Encounter: Payer: Self-pay | Admitting: Orthopedic Surgery

## 2020-06-07 ENCOUNTER — Ambulatory Visit (INDEPENDENT_AMBULATORY_CARE_PROVIDER_SITE_OTHER): Payer: Medicare Other | Admitting: Orthopedic Surgery

## 2020-06-07 VITALS — BP 96/68 | HR 91 | Ht 74.0 in | Wt 178.0 lb

## 2020-06-07 DIAGNOSIS — M25511 Pain in right shoulder: Secondary | ICD-10-CM | POA: Diagnosis not present

## 2020-06-07 DIAGNOSIS — S46011A Strain of muscle(s) and tendon(s) of the rotator cuff of right shoulder, initial encounter: Secondary | ICD-10-CM | POA: Diagnosis not present

## 2020-06-07 DIAGNOSIS — G8929 Other chronic pain: Secondary | ICD-10-CM | POA: Diagnosis not present

## 2020-06-07 NOTE — Patient Instructions (Addendum)
Follow up with Dr Karilyn Cota for your weight loss / and decreased appetite/ he should evaluate you for this  [facility], [facility] Dr [address]illa Sours can see you for your foot pin  [phone]

## 2020-06-07 NOTE — Progress Notes (Signed)
NEW PROBLEM//OFFICE VISIT  Chief Complaint  Patient presents with  . Shoulder Pain    right shoulder pain and he has been lifting some hay and is not sure if he has broken something,   . Weight Loss    has had weight loss recently does not have PCP     This 79 year old male was referred to [place] for weakness of his right shoulder presents complaining of weakness and pain right shoulder since lifting a bale of hay last year and since that time he said decreased motion decreased strength and pain in his right shoulder.   ROS  Patient complains of weight loss decreased appetite chronic pain in his back and neck  Past Medical History:  Diagnosis Date  . Back pain   . Chest pain   . Hypertension     Past Surgical History:  Procedure Laterality Date  . HERNIA REPAIR    . TRANSURETHRAL RESECTION OF PROSTATE      Family History  Problem Relation Age of Onset  . Cancer Father    Social History   Tobacco Use  . Smoking status: Current Every Day Smoker    Packs/day: 1.00    Types: Cigarettes  . Smokeless tobacco: Never Used  . Tobacco comment: pt reprots pack every two weeks.  Substance Use Topics  . Alcohol use: Yes    Comment: socail  . Drug use: No    No Known Allergies  Current Meds  Medication Sig  . aspirin EC 81 MG tablet Take 81 mg by mouth every morning.  . Cholecalciferol (VITAMIN D PO) Take 2 tablets by mouth daily.  Marland Kitchen HYDROcodone-acetaminophen (NORCO) 7.5-325 MG tablet Take 0.5 tablets by mouth every 6 (six) hours as needed for moderate pain.   . Multiple Vitamins-Minerals (ICAPS PO) Take 1 capsule by mouth daily.  . temazepam (RESTORIL) 30 MG capsule Take 30 mg by mouth at bedtime as needed for sleep.   Marland Kitchen triamterene-hydrochlorothiazide (MAXZIDE-25) 37.5-25 MG per tablet Take 0.5 tablets by mouth daily. For high blood pressure  . VITAMIN E PO Take 1 tablet by mouth every morning.    BP 96/68   Pulse 91   Ht 6\' 2"  (1.88 m)   Wt 178 lb (80.7 kg)   BMI  22.85 kg/m   Physical Exam Tall thin male awake alert and oriented x3 mood and affect normal no gait abnormalities  Ortho Exam  Left shoulder skin normal no tenderness normal range of motion no instability normal muscle tone  Right shoulder skin is normal tender over the right shoulder joint anteriorly has decreased rotation and external rotation 30 degrees is active forward elevation 90 degrees with abduction is 80 degrees he has weakness in the rotator cuff with lifting against gravity only    MEDICAL DECISION MAKING  A.  Encounter Diagnoses  Name Primary?  . Chronic right shoulder pain Yes  . Traumatic complete tear of right rotator cuff, initial encounter     B. DATA ANALYSED:   IMAGING: Interpretation of images: Our x-rays today show proximal migration of the humerus mild narrowing of the joint decreased acromion to humeral distance  Orders: Negative  Outside records reviewed: Negative   C. MANAGEMENT   I recommended the patient should have consult with shoulder specialist to get a shoulder replacement or reverse shoulder he says he does not want any surgery right now  He will see his primary care doctor regarding his weight loss  He says he had a colonoscopy about  20 years ago no PSA levels that he can remember  See Dr. Karilyn Cota for further follow-up  No orders of the defined types were placed in this encounter.     Fuller Canada, MD  06/07/2020 3:30 PM

## 2020-06-19 ENCOUNTER — Ambulatory Visit (INDEPENDENT_AMBULATORY_CARE_PROVIDER_SITE_OTHER): Payer: Medicare Other | Admitting: Internal Medicine

## 2020-07-30 ENCOUNTER — Other Ambulatory Visit: Payer: Self-pay

## 2020-07-30 ENCOUNTER — Ambulatory Visit (INDEPENDENT_AMBULATORY_CARE_PROVIDER_SITE_OTHER): Payer: Medicare Other | Admitting: Internal Medicine

## 2020-07-30 ENCOUNTER — Encounter (INDEPENDENT_AMBULATORY_CARE_PROVIDER_SITE_OTHER): Payer: Self-pay | Admitting: Internal Medicine

## 2020-07-30 VITALS — BP 133/70 | HR 56 | Temp 96.9°F | Resp 18 | Ht 74.0 in | Wt 184.0 lb

## 2020-07-30 DIAGNOSIS — R5383 Other fatigue: Secondary | ICD-10-CM

## 2020-07-30 DIAGNOSIS — M25511 Pain in right shoulder: Secondary | ICD-10-CM

## 2020-07-30 DIAGNOSIS — M79671 Pain in right foot: Secondary | ICD-10-CM

## 2020-07-30 DIAGNOSIS — I10 Essential (primary) hypertension: Secondary | ICD-10-CM

## 2020-07-30 DIAGNOSIS — R531 Weakness: Secondary | ICD-10-CM | POA: Diagnosis not present

## 2020-07-30 DIAGNOSIS — R350 Frequency of micturition: Secondary | ICD-10-CM

## 2020-07-30 DIAGNOSIS — M79672 Pain in left foot: Secondary | ICD-10-CM

## 2020-07-30 DIAGNOSIS — E559 Vitamin D deficiency, unspecified: Secondary | ICD-10-CM

## 2020-07-30 NOTE — Progress Notes (Signed)
Metrics: Intervention Frequency ACO  Documented Smoking Status Yearly  Screened one or more times in 24 months  Cessation Counseling or  Active cessation medication Past 24 months  Past 24 months   Guideline developer: UpToDate (See UpToDate for funding source) Date Released: 2014       Wellness Office Visit  Subjective:  Patient ID: Dennis Ruiz, male    DOB: Feb 27, 1941  Age: 79 y.o. MRN: [id number]  CC: This man comes into follow-up with hypertension and review symptoms such as fatigue and weight loss. HPI   He also discusses pain in both feet and wants to see a podiatrist. He continues on antihypertensive therapy as before. Past Medical History:  Diagnosis Date  . Back pain   . Chest pain   . Hypertension    Past Surgical History:  Procedure Laterality Date  . HERNIA REPAIR    . TRANSURETHRAL RESECTION OF PROSTATE       Family History  Problem Relation Age of Onset  . Cancer Father     Social History   Social History Narrative   Widowed   No regular exercise   Social History   Tobacco Use  . Smoking status: Current Every Day Smoker    Packs/day: 1.00    Types: Cigarettes  . Smokeless tobacco: Never Used  . Tobacco comment: pt reprots pack every two weeks.  Substance Use Topics  . Alcohol use: Yes    Comment: socail    Current Meds  Medication Sig  . aspirin EC 81 MG tablet Take 81 mg by mouth every morning.  . Cholecalciferol (VITAMIN D PO) Take 2 tablets by mouth daily.  Marland Kitchen HYDROcodone-acetaminophen (NORCO) 7.5-325 MG tablet Take 0.5 tablets by mouth every 6 (six) hours as needed for moderate pain.   . Multiple Vitamins-Minerals (ICAPS PO) Take 1 capsule by mouth daily.  . temazepam (RESTORIL) 30 MG capsule Take 30 mg by mouth at bedtime as needed for sleep.   Marland Kitchen triamterene-hydrochlorothiazide (MAXZIDE-25) 37.5-25 MG per tablet Take 0.5 tablets by mouth daily. For high blood pressure  . VITAMIN E PO Take 1 tablet by mouth every morning.       Depression screen [facility] 2/9 05/09/2020  Decreased Interest 0  Down, Depressed, Hopeless 0  PHQ - 2 Score 0     Objective:   Today's Vitals: BP 133/70 (BP Location: Right Arm, Patient Position: Sitting, Cuff Size: Normal)   Pulse (!) 56   Temp (!) 96.9 F (36.1 C) (Temporal)   Resp 18   Ht 6\' 2"  (1.88 m)   Wt 184 lb (83.5 kg)   SpO2 98%   BMI 23.62 kg/m  Vitals with BMI 07/30/2020 06/07/2020 05/09/2020  Height 6\' 2"  6\' 2"  6\' 2"   Weight 184 lbs 178 lbs 178 lbs  BMI 23.61 22.84 22.84  Systolic 133 96 136  Diastolic 70 68 89  Pulse 56 91 87     Physical Exam   Blood pressure is controlled for his age.  No other new physical findings.    Assessment   1. Weakness   2. Fatigue, unspecified type   3. Acute pain of right shoulder   4. Essential hypertension, benign   5. Increased frequency of urination   6. Vitamin D deficiency disease   7. Pain in both feet       Tests ordered Orders Placed This Encounter  Procedures  . CBC  . COMPLETE METABOLIC PANEL WITH GFR  . VITAMIN D 25 Hydroxy (Vit-D Deficiency,  Fractures)  . PSA, Total with Reflex to PSA, Free  . Ambulatory referral to Podiatry     Plan: 1. Blood work is ordered. 2. He will continue with antihypertensive therapy listed above. 3. I will send him to podiatry for his painful feet. 4. Follow-up in about 4 to 6 weeks.   No orders of the defined types were placed in this encounter.   Wilson Singer, MD

## 2020-07-31 LAB — COMPLETE METABOLIC PANEL WITH GFR
AG Ratio: 1.5 (calc) (ref 1.0–2.5)
ALT: 10 U/L (ref 9–46)
AST: 14 U/L (ref 10–35)
Albumin: 4.6 g/dL (ref 3.6–5.1)
Alkaline phosphatase (APISO): 70 U/L (ref 35–144)
BUN/Creatinine Ratio: 13 (calc) (ref 6–22)
BUN: 20 mg/dL (ref 7–25)
CO2: 31 mmol/L (ref 20–32)
Calcium: 9.3 mg/dL (ref 8.6–10.3)
Chloride: 98 mmol/L (ref 98–110)
Creat: 1.49 mg/dL — ABNORMAL HIGH (ref 0.70–1.11)
GFR, Est African American: 51 mL/min/{1.73_m2} — ABNORMAL LOW (ref >=60)
GFR, Est Non African American: 44 mL/min/{1.73_m2} — ABNORMAL LOW (ref >=60)
Globulin: 3.1 g/dL (calc) (ref 1.9–3.7)
Glucose, Bld: 89 mg/dL (ref 65–99)
Potassium: 4.5 mmol/L (ref 3.5–5.3)
Sodium: 137 mmol/L (ref 135–146)
Total Bilirubin: 0.5 mg/dL (ref 0.2–1.2)
Total Protein: 7.7 g/dL (ref 6.1–8.1)

## 2020-07-31 LAB — VITAMIN D 25 HYDROXY (VIT D DEFICIENCY, FRACTURES): Vit D, 25-Hydroxy: 26 ng/mL — ABNORMAL LOW (ref 30–100)

## 2020-07-31 LAB — PSA, TOTAL WITH REFLEX TO PSA, FREE: PSA, Total: 0.9 ng/mL (ref <=4.0)

## 2020-08-01 ENCOUNTER — Ambulatory Visit (INDEPENDENT_AMBULATORY_CARE_PROVIDER_SITE_OTHER): Payer: Medicare Other | Admitting: Podiatry

## 2020-08-01 ENCOUNTER — Other Ambulatory Visit: Payer: Self-pay

## 2020-08-01 ENCOUNTER — Encounter: Payer: Self-pay | Admitting: Podiatry

## 2020-08-01 ENCOUNTER — Ambulatory Visit: Payer: Medicare Other

## 2020-08-01 DIAGNOSIS — M19079 Primary osteoarthritis, unspecified ankle and foot: Secondary | ICD-10-CM

## 2020-08-01 DIAGNOSIS — B351 Tinea unguium: Secondary | ICD-10-CM

## 2020-08-01 DIAGNOSIS — M79675 Pain in left toe(s): Secondary | ICD-10-CM | POA: Diagnosis not present

## 2020-08-01 DIAGNOSIS — M79674 Pain in right toe(s): Secondary | ICD-10-CM | POA: Diagnosis not present

## 2020-08-01 MED ORDER — TERBINAFINE HCL 250 MG PO TABS
250.0000 mg | ORAL_TABLET | Freq: Every day | ORAL | 0 refills | Status: AC
Start: 2020-08-01 — End: 2020-10-30

## 2020-08-01 NOTE — Addendum Note (Signed)
Addended byLilian Kapur, Deonta Bomberger R on: 08/01/2020 01:59 PM   Modules accepted: Orders

## 2020-08-01 NOTE — Patient Instructions (Signed)
Fungal Nail Infection A fungal nail infection is a common infection of the toenails or fingernails. This condition affects toenails more often than fingernails. It often affects the great, or big, toes. More than one nail may be infected. The condition can be passed from person to person (is contagious). What are the causes? This condition is caused by a fungus. Several types of fungi can cause the infection. These fungi are common in moist and warm areas. If your hands or feet come into contact with the fungus, it may get into a crack in your fingernail or toenail and cause the infection. What increases the risk? The following factors may make you more likely to develop this condition:  Being male.  Being of older age.  Living with someone who has the fungus.  Walking barefoot in areas where the fungus thrives, such as showers or locker rooms.  Wearing shoes and socks that cause your feet to sweat.  Having a nail injury or a recent nail surgery.  Having certain medical conditions, such as: ? Athlete's foot. ? Diabetes. ? Psoriasis. ? Poor circulation. ? A weak body defense system (immune system). What are the signs or symptoms? Symptoms of this condition include:  A pale spot on the nail.  Thickening of the nail.  A nail that becomes yellow or brown.  A brittle or ragged nail edge.  A crumbling nail.  A nail that has lifted away from the nail bed. How is this diagnosed? This condition is diagnosed with a physical exam. Your health care provider may take a scraping or clipping from your nail to test for the fungus. How is this treated? Treatment is not needed for mild infections. If you have significant nail changes, treatment may include:  Antifungal medicines taken by mouth (orally). You may need to take the medicine for several weeks or several months, and you may not see the results for a long time. These medicines can cause side effects. Ask your health care provider  what problems to watch for.  Antifungal nail polish or nail cream. These may be used along with oral antifungal medicines.  Laser treatment of the nail.  Surgery to remove the nail. This may be needed for the most severe infections. It can take a long time, usually up to a year, for the infection to go away. The infection may also come back. Follow these instructions at home: Medicines  Take or apply over-the-counter and prescription medicines only as told by your health care provider.  Ask your health care provider about using over-the-counter mentholated ointment on your nails. Nail care  Trim your nails often.  Wash and dry your hands and feet every day.  Keep your feet dry: ? Wear absorbent socks, and change your socks frequently. ? Wear shoes that allow air to circulate, such as sandals or canvas tennis shoes. Throw out old shoes.  Do not use artificial nails.  If you go to a nail salon, make sure you choose one that uses clean instruments.  Use antifungal foot powder on your feet and in your shoes. General instructions  Do not share personal items, such as towels or nail clippers.  Do not walk barefoot in shower rooms or locker rooms.  Wear rubber gloves if you are working with your hands in wet areas.  Keep all follow-up visits as told by your health care provider. This is important. Contact a health care provider if: Your infection is not getting better or it is getting worse   after several months. Summary  A fungal nail infection is a common infection of the toenails or fingernails.  Treatment is not needed for mild infections. If you have significant nail changes, treatment may include taking medicine orally and applying medicine to your nails.  It can take a long time, usually up to a year, for the infection to go away. The infection may also come back.  Take or apply over-the-counter and prescription medicines only as told by your health care  provider.  Follow instructions for taking care of your nails to help prevent infection from coming back or spreading. This information is not intended to replace advice given to you by your health care provider. Make sure you discuss any questions you have with your health care provider. Document Revised: 01/27/2019 Document Reviewed: 03/12/2018 Elsevier Patient Education  2020 [employer]  

## 2020-08-01 NOTE — Progress Notes (Signed)
  Subjective:  Patient ID: Dennis Ruiz, male    DOB: 03/13/1941,  MRN: [id number]  Chief Complaint  Patient presents with  . Nail Problem    Patient presents today for nail trim, fungal nails and possible ingrown nails bilat hallux    79 y.o. male presents with the above complaint. History confirmed with patient. Nails have been discolored  Objective:  Physical Exam: warm, good capillary refill, no trophic changes or ulcerative lesions, normal DP and PT pulses and normal sensory exam. Onychomycosis of bilateral hallux nails Assessment:   1. Onychomycosis   2. Pain due to onychomycosis of toenails of both feet      Plan:  Patient was evaluated and treated and all questions answered.   Discussed the etiology and treatment options for the condition in detail with the patient. Educated patient on the topical and oral treatment options for mycotic nails. Recommended debridement of the nails today. Sharp and mechanical debridement performed of all painful and mycotic nails today. Nails debrided in length and thickness using a nail nipper and a mechanical burr to level of comfort. Discussed treatment options including appropriate shoe gear. Follow up as needed for painful nails.  LFTs ordered, will check today  Terbinafine #90 250mg  qD sent to pharmacy  F/U in 4 months  Return in about 4 months (around 12/02/2020) for nail re-check.

## 2020-08-02 ENCOUNTER — Encounter (INDEPENDENT_AMBULATORY_CARE_PROVIDER_SITE_OTHER): Payer: Self-pay

## 2020-08-02 LAB — HEPATIC FUNCTION PANEL
ALT: 11 IU/L (ref 0–44)
AST: 15 IU/L (ref 0–40)
Albumin: 4.9 g/dL — ABNORMAL HIGH (ref 3.7–4.7)
Alkaline Phosphatase: 81 IU/L (ref 44–121)
Bilirubin Total: 0.3 mg/dL (ref 0.0–1.2)
Bilirubin, Direct: 0.12 mg/dL (ref 0.00–0.40)
Total Protein: 8 g/dL (ref 6.0–8.5)

## 2020-08-02 NOTE — Progress Notes (Signed)
Call this patient back.  Tell him that he is getting a little bit more dehydrated and needs to drink more water.Also, his vitamin D levels are extremely low and he needs to make sure he is getting vitamin D3 10,000 units daily which she can get from the pharmacy.Follow-up as scheduled.

## 2020-08-02 NOTE — Progress Notes (Signed)
Return call to Mr Drees. Given instructions on the lab work. No answer, but left a voicemail on phone. Will send mail out with details and instructions.

## 2020-08-03 ENCOUNTER — Telehealth: Payer: Self-pay | Admitting: Podiatry

## 2020-08-03 NOTE — Telephone Encounter (Signed)
The patient just wanted me to let you know that his nails were cut too close because he had blood in his sock when he got home.

## 2020-09-04 ENCOUNTER — Encounter (INDEPENDENT_AMBULATORY_CARE_PROVIDER_SITE_OTHER): Payer: Self-pay | Admitting: Internal Medicine

## 2020-09-04 ENCOUNTER — Other Ambulatory Visit: Payer: Self-pay

## 2020-09-04 ENCOUNTER — Ambulatory Visit (INDEPENDENT_AMBULATORY_CARE_PROVIDER_SITE_OTHER): Payer: Medicare Other | Admitting: Internal Medicine

## 2020-09-04 VITALS — BP 116/58 | HR 80 | Temp 97.1°F | Ht 74.0 in | Wt 183.4 lb

## 2020-09-04 DIAGNOSIS — R5383 Other fatigue: Secondary | ICD-10-CM

## 2020-09-04 DIAGNOSIS — E559 Vitamin D deficiency, unspecified: Secondary | ICD-10-CM | POA: Diagnosis not present

## 2020-09-04 DIAGNOSIS — I10 Essential (primary) hypertension: Secondary | ICD-10-CM

## 2020-09-04 NOTE — Progress Notes (Signed)
Metrics: Intervention Frequency ACO  Documented Smoking Status Yearly  Screened one or more times in 24 months  Cessation Counseling or  Active cessation medication Past 24 months  Past 24 months   Guideline developer: UpToDate (See UpToDate for funding source) Date Released: 2014       Wellness Office Visit  Subjective:  Patient ID: Dennis Ruiz, male    DOB: [birth year]  Age: 79 y.o. MRN: [id number]  CC: This man comes in for follow-up of hypertension, vitamin D deficiency. HPI  He has been taking vitamin D3 10,000 units a day he believes. He continues with Maxide for his hypertension. Overall, he says he has more energy than he did last time. Past Medical History:  Diagnosis Date  . Back pain   . Chest pain   . Hypertension    Past Surgical History:  Procedure Laterality Date  . HERNIA REPAIR    . TRANSURETHRAL RESECTION OF PROSTATE       Family History  Problem Relation Age of Onset  . Cancer Father     Social History   Social History Narrative   Widowed   No regular exercise   Social History   Tobacco Use  . Smoking status: Current Every Day Smoker    Packs/day: 1.00    Types: Cigarettes  . Smokeless tobacco: Never Used  . Tobacco comment: pt reprots pack every two weeks.  Substance Use Topics  . Alcohol use: Yes    Comment: socail    Current Meds  Medication Sig  . aspirin EC 81 MG tablet Take 81 mg by mouth every morning.  . Cholecalciferol (VITAMIN D PO) Take 2 tablets by mouth daily.  Marland Kitchen HYDROcodone-acetaminophen (NORCO) 7.5-325 MG tablet Take 0.5 tablets by mouth every 6 (six) hours as needed for moderate pain.   . Multiple Vitamins-Minerals (ICAPS PO) Take 1 capsule by mouth daily.  . temazepam (RESTORIL) 30 MG capsule Take 30 mg by mouth at bedtime as needed for sleep.   Marland Kitchen terbinafine (LAMISIL) 250 MG tablet Take 1 tablet (250 mg total) by mouth daily.  Marland Kitchen triamterene-hydrochlorothiazide (MAXZIDE-25) 37.5-25 MG per tablet Take 0.5 tablets by  mouth daily. For high blood pressure  . VITAMIN E PO Take 1 tablet by mouth every morning.      Depression screen [facility] 2/9 05/09/2020  Decreased Interest 0  Down, Depressed, Hopeless 0  PHQ - 2 Score 0     Objective:   Today's Vitals: BP (!) 116/58   Pulse 80   Temp (!) 97.1 F (36.2 C) (Temporal)   Ht 6\' 2"  (1.88 m)   Wt 183 lb 6.4 oz (83.2 kg)   SpO2 96%   BMI 23.55 kg/m  Vitals with BMI 09/04/2020 07/30/2020 06/07/2020  Height 6\' 2"  6\' 2"  6\' 2"   Weight 183 lbs 6 oz 184 lbs 178 lbs  BMI 23.54 23.61 22.84  Systolic 116 133 96  Diastolic 58 70 68  Pulse 80 56 91     Physical Exam   He looks systemically well.  His weight is fairly stable.  Blood pressure is excellent.  Alert and orientated without any focal neurological signs.    Assessment   1. Vitamin D deficiency disease   2. Fatigue, unspecified type   3. Essential hypertension, benign       Tests ordered Orders Placed This Encounter  Procedures  . COMPLETE METABOLIC PANEL WITH GFR  . VITAMIN D 25 Hydroxy (Vit-D Deficiency, Fractures)     Plan: 1.  He will continue with vitamin D3 10,000 units daily and I will check levels. 2. He will continue with Maxide for his hypertension and we will check electrolytes. 3. Prevnar 13 vaccination was given today. 4. Follow-up with Sarah in 3 months time   No orders of the defined types were placed in this encounter.   Wilson Singer, MD

## 2020-09-05 LAB — COMPLETE METABOLIC PANEL WITH GFR
AG Ratio: 1.6 (calc) (ref 1.0–2.5)
ALT: 9 U/L (ref 9–46)
AST: 16 U/L (ref 10–35)
Albumin: 4.7 g/dL (ref 3.6–5.1)
Alkaline phosphatase (APISO): 70 U/L (ref 35–144)
BUN/Creatinine Ratio: 12 (calc) (ref 6–22)
BUN: 19 mg/dL (ref 7–25)
CO2: 29 mmol/L (ref 20–32)
Calcium: 9.7 mg/dL (ref 8.6–10.3)
Chloride: 99 mmol/L (ref 98–110)
Creat: 1.55 mg/dL — ABNORMAL HIGH (ref 0.70–1.11)
GFR, Est African American: 48 mL/min/{1.73_m2} — ABNORMAL LOW (ref >=60)
GFR, Est Non African American: 42 mL/min/{1.73_m2} — ABNORMAL LOW (ref >=60)
Globulin: 3 g/dL (calc) (ref 1.9–3.7)
Glucose, Bld: 90 mg/dL (ref 65–99)
Potassium: 5.2 mmol/L (ref 3.5–5.3)
Sodium: 138 mmol/L (ref 135–146)
Total Bilirubin: 0.5 mg/dL (ref 0.2–1.2)
Total Protein: 7.7 g/dL (ref 6.1–8.1)

## 2020-09-05 LAB — VITAMIN D 25 HYDROXY (VIT D DEFICIENCY, FRACTURES): Vit D, 25-Hydroxy: 28 ng/mL — ABNORMAL LOW (ref 30–100)

## 2020-09-05 NOTE — Progress Notes (Signed)
Call this patient and tell him that his kidney function is getting worse.  Tell him to stop the Maxide.Also his vitamin D levels remain extremely low.  He was not sure what dose of vitamin D3 he was taking.  Make sure that he knows that he needs to take vitamin D3 10,000 units daily.Make him a follow-up appointment with Sarah in 1 month to review his blood pressure again.  Thanks.

## 2020-10-10 ENCOUNTER — Ambulatory Visit (INDEPENDENT_AMBULATORY_CARE_PROVIDER_SITE_OTHER): Payer: Medicare Other | Admitting: Nurse Practitioner

## 2020-10-10 ENCOUNTER — Encounter (INDEPENDENT_AMBULATORY_CARE_PROVIDER_SITE_OTHER): Payer: Self-pay | Admitting: Nurse Practitioner

## 2020-10-10 ENCOUNTER — Other Ambulatory Visit: Payer: Self-pay

## 2020-10-10 VITALS — BP 110/74 | HR 104 | Temp 97.8°F | Ht 74.0 in | Wt 187.0 lb

## 2020-10-10 DIAGNOSIS — R351 Nocturia: Secondary | ICD-10-CM

## 2020-10-10 DIAGNOSIS — E559 Vitamin D deficiency, unspecified: Secondary | ICD-10-CM | POA: Diagnosis not present

## 2020-10-10 DIAGNOSIS — R12 Heartburn: Secondary | ICD-10-CM | POA: Diagnosis not present

## 2020-10-10 DIAGNOSIS — N1831 Chronic kidney disease, stage 3a: Secondary | ICD-10-CM | POA: Diagnosis not present

## 2020-10-10 DIAGNOSIS — I10 Essential (primary) hypertension: Secondary | ICD-10-CM

## 2020-10-10 MED ORDER — FAMOTIDINE 20 MG PO TABS: 20.0000 mg | ORAL_TABLET | Freq: Two times a day (BID) | ORAL | 2 refills | Status: DC

## 2020-10-10 NOTE — Progress Notes (Signed)
Subjective:  Patient ID: Dennis Ruiz, male    DOB: 07-25-1941  Age: 79 y.o. MRN: [id number]  CC:  Chief Complaint  Patient presents with  . Follow-up    Waking up with dry throat and thirsty, gets up to urinate 2-3 times a night      HPI  This patient arrives today for the above.  Hypertension: He was on Maxide but this was stopped a few weeks ago after patient's last metabolic panel showed some worsening renal function.  Per chart review I do see that back in 2018 patient's GFR was greater than 60, but when checked in June of this year GFR was 55.  In November GFR was 51 with a creatinine of 1.49.    Vitamin D deficiency: He also has a history of low vitamin D level.  He does continue on vitamin D3 supplement.  He tells me he is taking 10,000 IUs of vitamin D3 daily.  Last serum level was checked approximately 1 month ago and it was 28.  At that time it was unclear whether or not he was taking 1000 or 10,000 IUs of vitamin D3.  Dry throat: He tells me that over the last "couple of months" he has been waking up 2-3 times a week at night with a dry throat.  He tells me that he does have heartburn and has to take milk of magnesia a few times a week to treat heartburn.  He does endorse smoking (1 pack usually last about 1 month), 2 cups of coffee daily, and significant amounts of chocolate intake.  He also mentions that sometimes when he is taking his pills he does feel it is difficult to swallow them, but does not experience this when eating food or drinking.  He does mention that he makes it a point to chew his food thoroughly before swallowing.  Nocturia: He has been waking up to 3 times a night to urinate.  He does have a history of undergoing TURP, last PSA was collected approximately 2 months ago and it was 0.9.  He denies any hematuria or dysuria.   Past Medical History:  Diagnosis Date  . Back pain   . Chest pain   . Hypertension       Family History  Problem Relation  Age of Onset  . Cancer Father     Social History   Social History Narrative   Widowed   No regular exercise   Social History   Tobacco Use  . Smoking status: Current Every Day Smoker    Packs/day: 1.00    Types: Cigarettes  . Smokeless tobacco: Never Used  . Tobacco comment: pt reprots pack every two weeks.  Substance Use Topics  . Alcohol use: Yes    Comment: socail     Current Meds  Medication Sig  . aspirin EC 81 MG tablet Take 81 mg by mouth every morning.  . Cholecalciferol (VITAMIN D PO) Take 2 tablets by mouth daily.  Marland Kitchen HYDROcodone-acetaminophen (NORCO) 7.5-325 MG tablet Take 0.5 tablets by mouth every 6 (six) hours as needed for moderate pain.   . Multiple Vitamins-Minerals (ICAPS PO) Take 1 capsule by mouth daily.  . temazepam (RESTORIL) 30 MG capsule Take 30 mg by mouth at bedtime as needed for sleep.   Marland Kitchen terbinafine (LAMISIL) 250 MG tablet Take 1 tablet (250 mg total) by mouth daily.  Marland Kitchen VITAMIN E PO Take 1 tablet by mouth every morning.    ROS:  Review of Systems  Constitutional: Negative for malaise/fatigue and weight loss.  Respiratory: Negative for cough.   Gastrointestinal: Positive for abdominal pain and heartburn. Negative for blood in stool, melena, nausea and vomiting.       (-) odynophagia, (+) dysphagia when taking medications  Genitourinary: Positive for frequency. Negative for dysuria, hematuria and urgency (nocturia).     Objective:   Today's Vitals: BP 110/74   Pulse (!) 104   Temp 97.8 F (36.6 C) (Temporal)   Ht 6' 2" (1.88 m)   Wt 187 lb (84.8 kg)   SpO2 98%   BMI 24.01 kg/m  Vitals with BMI 10/10/2020 09/04/2020 07/30/2020  Height 6' 2" 6' 2" 6' 2"  Weight 187 lbs 183 lbs 6 oz 184 lbs  BMI 24 23.54 23.61  Systolic 110 116 133  Diastolic 74 58 70  Pulse 104 80 56     Physical Exam Vitals reviewed. Exam conducted with a chaperone present.  Constitutional:      Appearance: Normal appearance.  HENT:     Head: Normocephalic  and atraumatic.  Cardiovascular:     Rate and Rhythm: Normal rate and regular rhythm.  Pulmonary:     Effort: Pulmonary effort is normal.     Breath sounds: Normal breath sounds.  Abdominal:     General: Abdomen is flat. Bowel sounds are normal.     Palpations: Abdomen is soft. There is no hepatomegaly, splenomegaly or mass.     Tenderness: There is no abdominal tenderness.     Hernia: No hernia is present.  Genitourinary:    Prostate: Not enlarged, not tender and no nodules present.     Rectum: Normal.     Comments: Megan Fagge as chaperone Musculoskeletal:     Cervical back: Neck supple.  Skin:    General: Skin is warm and dry.  Neurological:     Mental Status: He is alert and oriented to person, place, and time.  Psychiatric:        Mood and Affect: Mood normal.        Behavior: Behavior normal.        Thought Content: Thought content normal.        Judgment: Judgment normal.          Assessment and Plan   1. Heartburn   2. Vitamin D deficiency disease   3. Essential hypertension, benign   4. Stage 3a chronic kidney disease ([facility])   5. Nocturia      Plan: 1.  Not sure of current etiology of his "dry throat".  For now will treat him empirically for heartburn.  We will start him on famotidine that he will take twice a day and I encouraged him to reduce his smoking, caffeine intake, and to avoid chocolate over the next month.  If symptoms persist despite trying these may need to consider referral to gastroenterology.  Also encouraged him to make sure he is hydrating thoroughly and regularly throughout the day with water. 2.  We will check serum level of vitamin D today. 3.  Blood pressure well controlled even without his antihypertensive.  We will continue to monitor closely. 4.  We will recheck CMP today to see if renal function has improved with discontinuation of hydrochlorothiazide.  If not may need to consider referral to nephrology. 5.  Prostate not enlarged on  exam today.  Not sure of etiology of this at this time but will check urinalysis and may consider referral to urology.     Tests ordered Orders Placed This Encounter  Procedures  . CMP with eGFR([facility])  . Urinalysis with Culture Reflex  . Vitamin D, 25-hydroxy      Meds ordered this encounter  Medications  . famotidine (PEPCID) 20 MG tablet    Sig: Take 1 tablet (20 mg total) by mouth 2 (two) times daily.    Dispense:  60 tablet    Refill:  2    Order Specific Question:   Supervising Provider    Answer:   GOSRANI, NIMISH C [[id number]]    Patient to follow-up in 1 month  SARAH E GRAY, NP  

## 2020-10-11 ENCOUNTER — Other Ambulatory Visit (INDEPENDENT_AMBULATORY_CARE_PROVIDER_SITE_OTHER): Payer: Self-pay | Admitting: Nurse Practitioner

## 2020-10-11 DIAGNOSIS — N1831 Chronic kidney disease, stage 3a: Secondary | ICD-10-CM

## 2020-10-11 LAB — COMPLETE METABOLIC PANEL WITH GFR
AG Ratio: 1.5 (calc) (ref 1.0–2.5)
ALT: 11 U/L (ref 9–46)
AST: 17 U/L (ref 10–35)
Albumin: 4.4 g/dL (ref 3.6–5.1)
Alkaline phosphatase (APISO): 70 U/L (ref 35–144)
BUN/Creatinine Ratio: 13 (calc) (ref 6–22)
BUN: 21 mg/dL (ref 7–25)
CO2: 28 mmol/L (ref 20–32)
Calcium: 9.2 mg/dL (ref 8.6–10.3)
Chloride: 99 mmol/L (ref 98–110)
Creat: 1.56 mg/dL — ABNORMAL HIGH (ref 0.70–1.11)
GFR, Est African American: 48 mL/min/{1.73_m2} — ABNORMAL LOW (ref >=60)
GFR, Est Non African American: 41 mL/min/{1.73_m2} — ABNORMAL LOW (ref >=60)
Globulin: 3 g/dL (calc) (ref 1.9–3.7)
Glucose, Bld: 90 mg/dL (ref 65–139)
Potassium: 4.5 mmol/L (ref 3.5–5.3)
Sodium: 137 mmol/L (ref 135–146)
Total Bilirubin: 0.4 mg/dL (ref 0.2–1.2)
Total Protein: 7.4 g/dL (ref 6.1–8.1)

## 2020-10-11 LAB — URINALYSIS W MICROSCOPIC + REFLEX CULTURE
Bacteria, UA: NONE SEEN /HPF
Bilirubin Urine: NEGATIVE
Glucose, UA: NEGATIVE
Hgb urine dipstick: NEGATIVE
Hyaline Cast: NONE SEEN /LPF
Ketones, ur: NEGATIVE
Leukocyte Esterase: NEGATIVE
Nitrites, Initial: NEGATIVE
Protein, ur: NEGATIVE
RBC / HPF: NONE SEEN /HPF (ref 0–2)
Specific Gravity, Urine: 1.009 (ref 1.001–1.03)
Squamous Epithelial / HPF: NONE SEEN /HPF (ref <=5)
WBC, UA: NONE SEEN /HPF (ref 0–5)
pH: <=5 (ref 5.0–8.0)

## 2020-10-11 LAB — VITAMIN D 25 HYDROXY (VIT D DEFICIENCY, FRACTURES): Vit D, 25-Hydroxy: 58 ng/mL (ref 30–100)

## 2020-10-11 LAB — NO CULTURE INDICATED

## 2020-10-11 NOTE — Addendum Note (Signed)
Addended by: Elenore Paddy on: 10/11/2020 08:26 AM   Modules accepted: Orders

## 2020-12-05 ENCOUNTER — Ambulatory Visit: Payer: Medicare Other | Admitting: Podiatry

## 2020-12-05 ENCOUNTER — Ambulatory Visit (INDEPENDENT_AMBULATORY_CARE_PROVIDER_SITE_OTHER): Payer: Medicare Other | Admitting: Nurse Practitioner

## 2021-01-07 ENCOUNTER — Other Ambulatory Visit: Payer: Self-pay

## 2021-01-07 ENCOUNTER — Ambulatory Visit (INDEPENDENT_AMBULATORY_CARE_PROVIDER_SITE_OTHER): Payer: Medicare Other | Admitting: Nurse Practitioner

## 2021-01-07 ENCOUNTER — Encounter (INDEPENDENT_AMBULATORY_CARE_PROVIDER_SITE_OTHER): Payer: Self-pay | Admitting: Nurse Practitioner

## 2021-01-07 ENCOUNTER — Telehealth (INDEPENDENT_AMBULATORY_CARE_PROVIDER_SITE_OTHER): Payer: Self-pay | Admitting: Nurse Practitioner

## 2021-01-07 VITALS — BP 141/78 | HR 92 | Temp 97.5°F | Ht 74.0 in | Wt 188.0 lb

## 2021-01-07 DIAGNOSIS — N401 Enlarged prostate with lower urinary tract symptoms: Secondary | ICD-10-CM

## 2021-01-07 DIAGNOSIS — R350 Frequency of micturition: Secondary | ICD-10-CM | POA: Diagnosis not present

## 2021-01-07 DIAGNOSIS — I10 Essential (primary) hypertension: Secondary | ICD-10-CM

## 2021-01-07 DIAGNOSIS — E559 Vitamin D deficiency, unspecified: Secondary | ICD-10-CM

## 2021-01-07 DIAGNOSIS — N1831 Chronic kidney disease, stage 3a: Secondary | ICD-10-CM

## 2021-01-07 MED ORDER — TAMSULOSIN HCL 0.4 MG PO CAPS: 0.4000 mg | ORAL_CAPSULE | Freq: Every day | ORAL | 3 refills | Status: DC

## 2021-01-07 MED ORDER — TRIAMTERENE-HCTZ 37.5-25 MG PO TABS: 1.0000 | ORAL_TABLET | Freq: Every day | ORAL | 3 refills | Status: DC

## 2021-01-07 NOTE — Telephone Encounter (Signed)
Please look into nephrology referral from December 2021. Patient states he hasn't heard from Dr. Wolfgang Phoenix to get the appointment set up. Thank you.

## 2021-01-07 NOTE — Progress Notes (Signed)
Subjective:  Patient ID: Dennis Ruiz, male    DOB: [birth year]  Age: 80 y.o. MRN: [id number]  CC:  Chief Complaint  Patient presents with  . Hypertension  . Other    Vitamin D deficiency  . Chronic Kidney Disease  . Benign Prostatic Hypertrophy      HPI  This patient arrives today for the above.  Hypertension: Is a history of hypertension and continues on Maxide.  He tells me is tolerating medication well, however he mentions that he urinates frequently throughout the day and during the night.  Vitamin D deficiency: He increased his intake to 10,000 IUs daily since last office visit which was approximately 3 months ago.  He is due to have serum check today.  Chronic kidney disease: He has a history of chronic kidney disease and was referred to nephrology in December, however he tells me he has not yet heard from the nephrologist to have this appointment scheduled.  BPH: Last PSA was 0.9.  He is not on any medication currently but does complain of frequent urination throughout the day and night.  Urinalysis at last office visit was clear.  Past Medical History:  Diagnosis Date  . Back pain   . Chest pain   . Hypertension       Family History  Problem Relation Age of Onset  . Cancer Father     Social History   Social History Narrative   Widowed   No regular exercise   Social History   Tobacco Use  . Smoking status: Current Every Day Smoker    Packs/day: 1.00    Types: Cigarettes  . Smokeless tobacco: Never Used  . Tobacco comment: pt reprots pack every two weeks.  Substance Use Topics  . Alcohol use: Yes    Comment: socail     Current Meds  Medication Sig  . aspirin EC 81 MG tablet Take 81 mg by mouth every morning.  . Cholecalciferol (VITAMIN D PO) Take 2 tablets by mouth daily. 10,000IUs daily  . famotidine (PEPCID) 20 MG tablet Take 20 mg by mouth at bedtime.  Marland Kitchen HYDROcodone-acetaminophen (NORCO) 7.5-325 MG tablet Take 0.5 tablets by mouth  every 6 (six) hours as needed for moderate pain.   . Multiple Vitamins-Minerals (ICAPS PO) Take 1 capsule by mouth daily.  . tamsulosin (FLOMAX) 0.4 MG CAPS capsule Take 1 capsule (0.4 mg total) by mouth daily.  . temazepam (RESTORIL) 30 MG capsule Take 30 mg by mouth at bedtime as needed for sleep.   Marland Kitchen triamterene-hydrochlorothiazide (MAXZIDE-25) 37.5-25 MG tablet Take 1 tablet by mouth daily.  Marland Kitchen VITAMIN E PO Take 1 tablet by mouth every morning.    ROS:  Review of Systems  Constitutional: Negative for fever and weight loss.  Respiratory: Negative for shortness of breath.   Cardiovascular: Negative for chest pain.  Gastrointestinal: Negative for abdominal pain.  Neurological: Positive for headaches (intermittently).     Objective:   Today's Vitals: BP (!) 141/78   Pulse 92   Temp (!) 97.5 F (36.4 C)   Ht 6' 2"  (1.88 m)   Wt 188 lb (85.3 kg)   SpO2 95%   BMI 24.14 kg/m  Vitals with BMI 01/07/2021 10/10/2020 09/04/2020  Height 6' 2"  6' 2"  6' 2"   Weight 188 lbs 187 lbs 183 lbs 6 oz  BMI 34.35 24 68.61  Systolic 683 729 021  Diastolic 78 74 58  Pulse 92 104 80     Physical Exam  Vitals reviewed.  Constitutional:      Appearance: Normal appearance.  HENT:     Head: Normocephalic and atraumatic.  Cardiovascular:     Rate and Rhythm: Normal rate and regular rhythm.  Pulmonary:     Effort: Pulmonary effort is normal.     Breath sounds: Normal breath sounds.  Musculoskeletal:     Cervical back: Neck supple.  Skin:    General: Skin is warm and dry.  Neurological:     Mental Status: He is alert and oriented to person, place, and time.  Psychiatric:        Mood and Affect: Mood normal.        Behavior: Behavior normal.        Thought Content: Thought content normal.        Judgment: Judgment normal.          Assessment and Plan   1. Increased frequency of urination   2. Benign prostatic hyperplasia with urinary frequency   3. Vitamin D deficiency disease    4. Benign hypertension   5. Stage 3a chronic kidney disease ([place])      Plan: 1.,  2.  Per shared decision making we will trial tamsulosin.  His symptoms do not improve we will need to refer patient to urology.  May also consider changing Maxide to another agent without diuretic if symptoms continue. 3.  We will check serum vitamin D level today. 4.  We will check metabolic panel today.  Blood pressure a bit above goal today, however previous check showed normal blood pressure so we will hold off on making adjustments to medications. 5.  We will check on referral to nephrology.  Tests ordered Orders Placed This Encounter  Procedures  . CMP with eGFR([facility])  . Vitamin D, 25-hydroxy      Meds ordered this encounter  Medications  . tamsulosin (FLOMAX) 0.4 MG CAPS capsule    Sig: Take 1 capsule (0.4 mg total) by mouth daily.    Dispense:  30 capsule    Refill:  3    Order Specific Question:   Supervising Provider    Answer:   Hurshel Party C [[id number]]  . triamterene-hydrochlorothiazide (MAXZIDE-25) 37.5-25 MG tablet    Sig: Take 1 tablet by mouth daily.    Dispense:  90 tablet    Refill:  3    Order Specific Question:   Supervising Provider    Answer:   Doree Albee [[id number]]    Patient to follow-up in 6 weeks or sooner as needed.  Ailene Ards, NP

## 2021-01-07 NOTE — Patient Instructions (Signed)
Tamsulosin capsules What is this medicine? TAMSULOSIN (tam SOO loe sin) is an alpha blocker. It is used to treat the signs and symptoms of an enlarged prostate in men. This condition is also called benign prostatic hyperplasia (BPH). This medicine may be used for other purposes; ask your health care provider or pharmacist if you have questions. COMMON BRAND NAME(S): Flomax What should I tell my health care provider before I take this medicine? They need to know if you have any of the following conditions:  advanced kidney disease  advanced liver disease  low blood pressure  prostate cancer  an unusual or allergic reaction to tamsulosin, sulfa drugs, other medicines, foods, dyes, or preservatives  pregnant or trying to get pregnant  breast-feeding How should I use this medicine? Take this medicine by mouth about 30 minutes after the same meal every day. Follow the directions on the prescription label. Swallow the capsules whole with a glass of water. Do not crush, chew, or open capsules. Do not take your medicine more often than directed. Do not stop taking your medicine unless your doctor tells you to. Talk to your pediatrician regarding the use of this medicine in children. Special care may be needed. Overdosage: If you think you have taken too much of this medicine contact a poison control center or emergency room at once. NOTE: This medicine is only for you. Do not share this medicine with others. What if I miss a dose? If you miss a dose, take it as soon as you can. If it is almost time for your next dose, take only that dose. Do not take double or extra doses. If you stop taking your medicine for several days or more, ask your doctor or health care professional what dose you should start back on. What may interact with this medicine?  cimetidine  fluoxetine  ketoconazole  medicines for erectile disfunction like sildenafil, tadalafil, vardenafil  medicines for high blood  pressure  other alpha-blockers like alfuzosin, doxazosin, phentolamine, phenoxybenzamine, prazosin, terazosin  warfarin This list may not describe all possible interactions. Give your health care provider a list of all the medicines, herbs, non-prescription drugs, or dietary supplements you use. Also tell them if you smoke, drink alcohol, or use illegal drugs. Some items may interact with your medicine. What should I watch for while using this medicine? Visit your doctor or health care professional for regular check ups. You will need lab work done before you start this medicine and regularly while you are taking it. Check your blood pressure as directed. Ask your health care professional what your blood pressure should be, and when you should contact him or her. This medicine may make you feel dizzy or lightheaded. This is more likely to happen after the first dose, after an increase in dose, or during hot weather or exercise. Drinking alcohol and taking some medicines can make this worse. Do not drive, use machinery, or do anything that needs mental alertness until you know how this medicine affects you. Do not sit or stand up quickly. If you begin to feel dizzy, sit down until you feel better. These effects can decrease once your body adjusts to the medicine. Contact your doctor or health care professional right away if you have an erection that lasts longer than 4 hours or if it becomes painful. This may be a sign of a serious problem and must be treated right away to prevent permanent damage. If you are thinking of having cataract surgery, tell your   eye surgeon that you have taken this medicine. What side effects may I notice from receiving this medicine? Side effects that you should report to your doctor or health care professional as soon as possible:  allergic reactions like skin rash or itching, hives, swelling of the lips, mouth, tongue, or throat  breathing problems  change in  vision  feeling faint or lightheaded  irregular heartbeat  prolonged or painful erection  weakness Side effects that usually do not require medical attention (report to your doctor or health care professional if they continue or are bothersome):  back pain  change in sex drive or performance  constipation, nausea or vomiting  cough  drowsy  runny or stuffy nose  trouble sleeping This list may not describe all possible side effects. Call your doctor for medical advice about side effects. You may report side effects to FDA at 1-800-FDA-1088. Where should I keep my medicine? Keep out of the reach of children. Store at room temperature between 15 and 30 degrees C (59 and 86 degrees F). Throw away any unused medicine after the expiration date. NOTE: This sheet is a summary. It may not cover all possible information. If you have questions about this medicine, talk to your doctor, pharmacist, or health care provider.  2021 Elsevier/Gold Standard (2018-03-11 12:54:06)  

## 2021-01-08 ENCOUNTER — Encounter (INDEPENDENT_AMBULATORY_CARE_PROVIDER_SITE_OTHER): Payer: Self-pay | Admitting: Nurse Practitioner

## 2021-01-08 LAB — COMPLETE METABOLIC PANEL WITH GFR
AG Ratio: 1.3 (calc) (ref 1.0–2.5)
ALT: 13 U/L (ref 9–46)
AST: 17 U/L (ref 10–35)
Albumin: 4.4 g/dL (ref 3.6–5.1)
Alkaline phosphatase (APISO): 56 U/L (ref 35–144)
BUN/Creatinine Ratio: 15 (calc) (ref 6–22)
BUN: 21 mg/dL (ref 7–25)
CO2: 28 mmol/L (ref 20–32)
Calcium: 9.5 mg/dL (ref 8.6–10.3)
Chloride: 101 mmol/L (ref 98–110)
Creat: 1.41 mg/dL — ABNORMAL HIGH (ref 0.70–1.11)
GFR, Est African American: 54 mL/min/{1.73_m2} — ABNORMAL LOW (ref >=60)
GFR, Est Non African American: 46 mL/min/{1.73_m2} — ABNORMAL LOW (ref >=60)
Globulin: 3.4 g/dL (calc) (ref 1.9–3.7)
Glucose, Bld: 106 mg/dL (ref 65–139)
Potassium: 4.2 mmol/L (ref 3.5–5.3)
Sodium: 139 mmol/L (ref 135–146)
Total Bilirubin: 0.4 mg/dL (ref 0.2–1.2)
Total Protein: 7.8 g/dL (ref 6.1–8.1)

## 2021-01-08 LAB — VITAMIN D 25 HYDROXY (VIT D DEFICIENCY, FRACTURES): Vit D, 25-Hydroxy: 78 ng/mL (ref 30–100)

## 2021-01-10 NOTE — Telephone Encounter (Signed)
General 10/11/2020 10:27 AM Dennis Ruiz, Dennis Ruiz, RMA - -  Note   Sent to Dr Wolfgang Phoenix; sent notes & referral via EMR fax.

## 2021-01-23 ENCOUNTER — Telehealth (INDEPENDENT_AMBULATORY_CARE_PROVIDER_SITE_OTHER): Payer: Self-pay

## 2021-01-23 ENCOUNTER — Other Ambulatory Visit (INDEPENDENT_AMBULATORY_CARE_PROVIDER_SITE_OTHER): Payer: Self-pay | Admitting: Nurse Practitioner

## 2021-01-23 DIAGNOSIS — R350 Frequency of micturition: Secondary | ICD-10-CM

## 2021-01-23 NOTE — Telephone Encounter (Signed)
Called patient and gave him the message from Maralyn Sago. Patient verbalized an understanding and thanked [place] for letting him know.

## 2021-01-23 NOTE — Telephone Encounter (Signed)
Patient called and stated that he received his lab results in the mail but does not know what the numbers mean? Can you let me know the results of his most recent labs from 01/07/2021?  Also patient stated that he would like a referral to a urologist because he stated that he is urinating too much and feels like something needs to be checked more thoroughly.

## 2021-01-23 NOTE — Telephone Encounter (Signed)
On the bottom of the letter it indicates that his blood work is stable and he should continue his current medications.  I do not have any further recommendations at this time based on his blood work I will order referral to urology today for him.  Thank you.

## 2021-01-31 IMAGING — DX DG LUMBAR SPINE COMPLETE 4+V
5 series · 5 of 5 positions shown · non-contrast
Comparison: None.

CLINICAL DATA: Several months of low back pain.  No injury.

EXAM:
LUMBAR SPINE - COMPLETE 4+ VIEW

[l-spine ap]
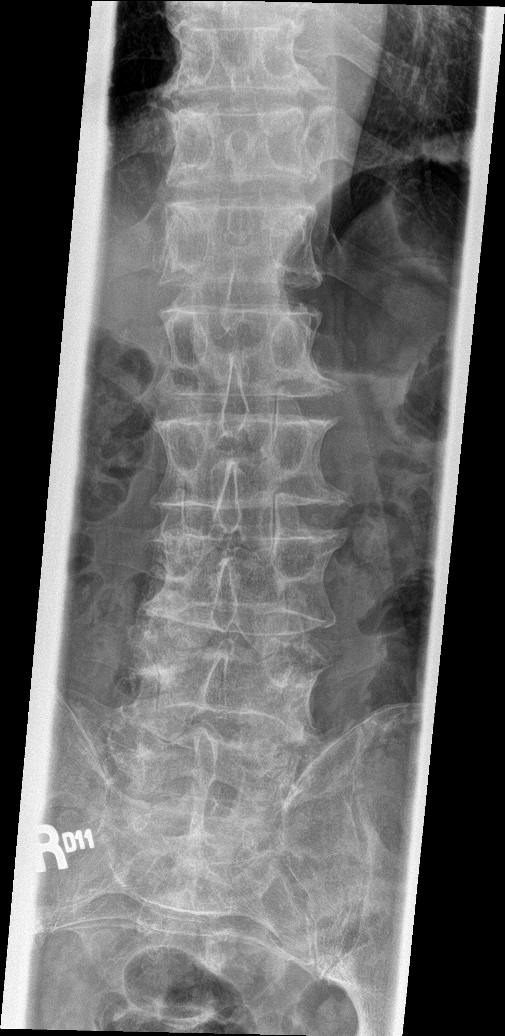

[l-spine obl (1 of 2)]
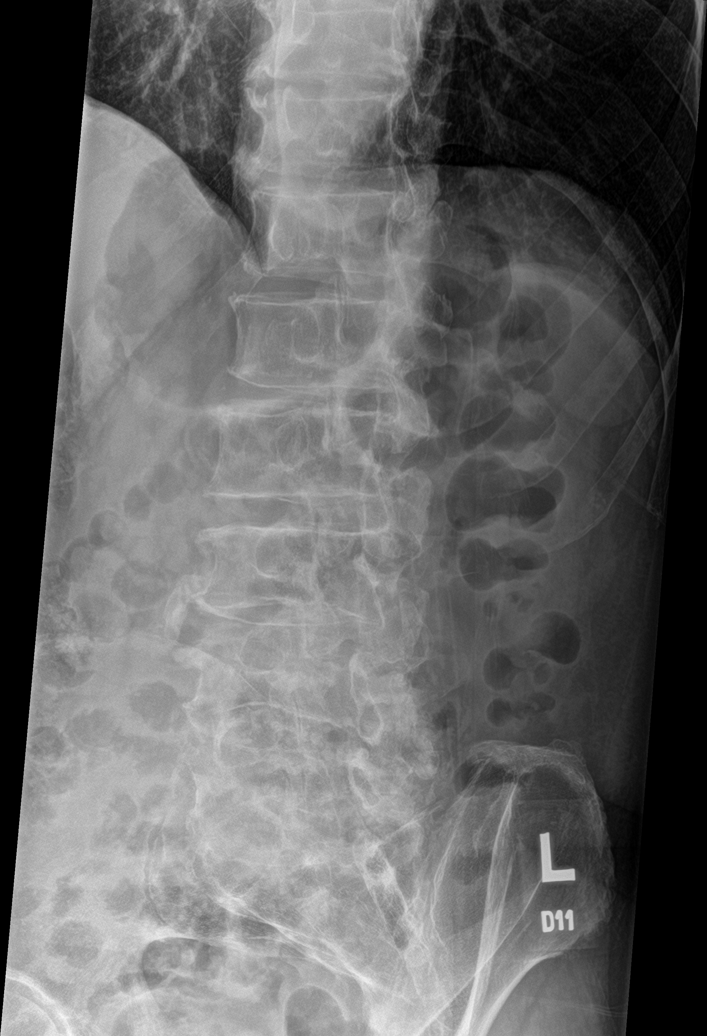

[l-spine obl (2 of 2)]
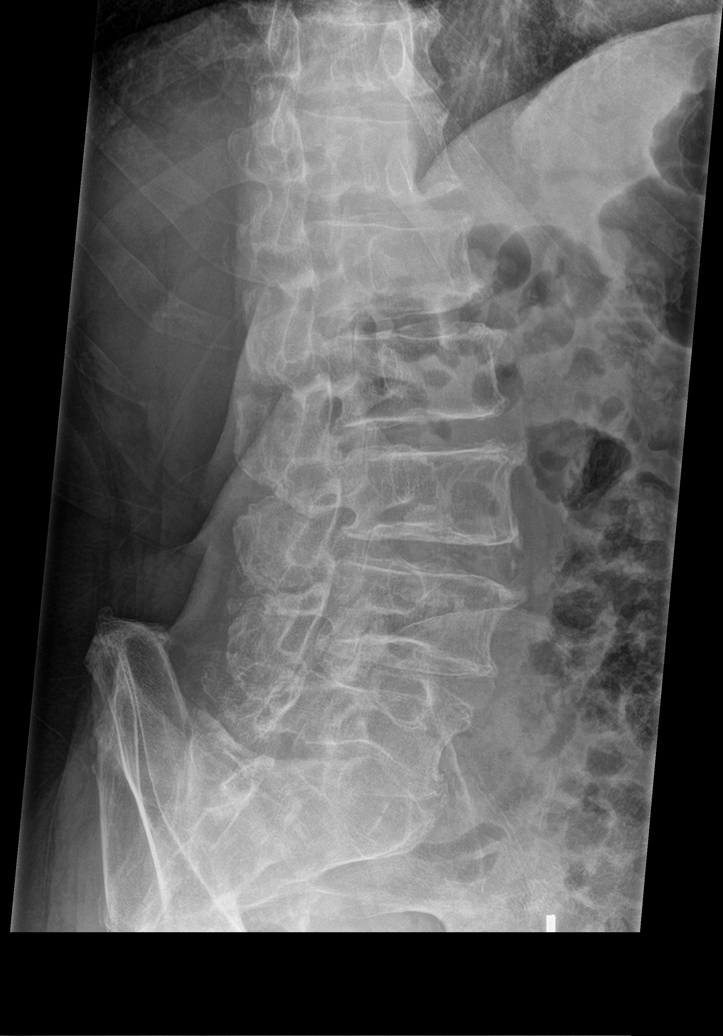

[l-spine lat]
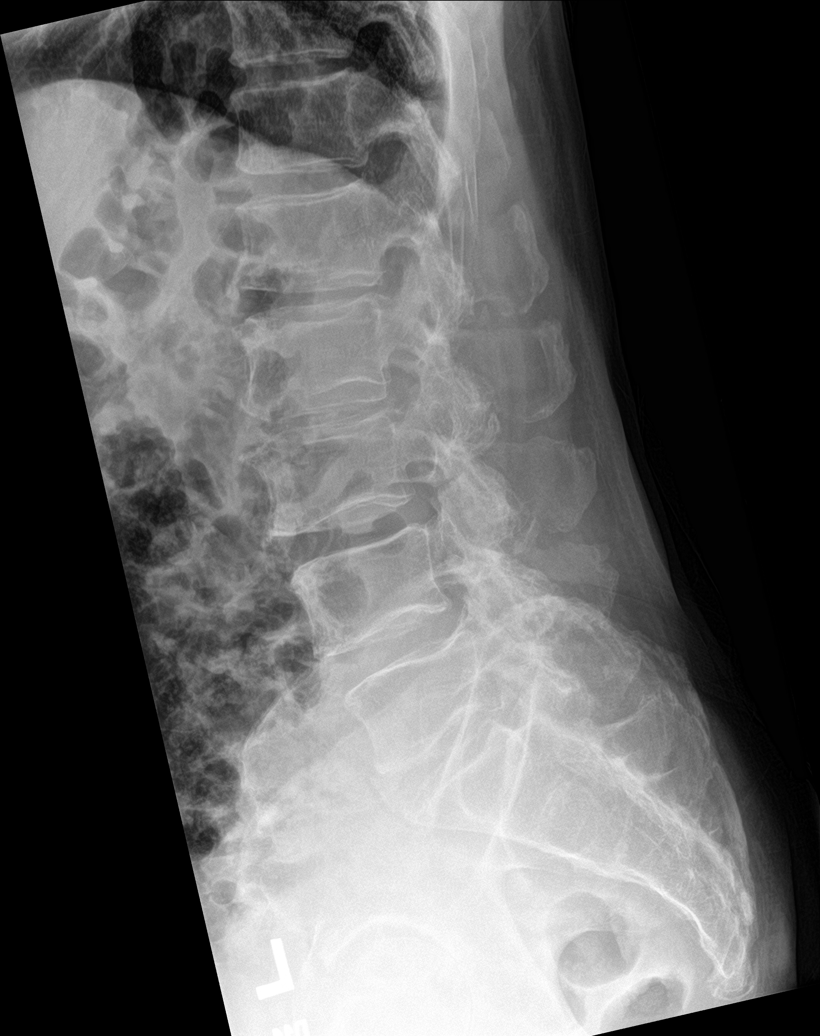

[l-spine spot]
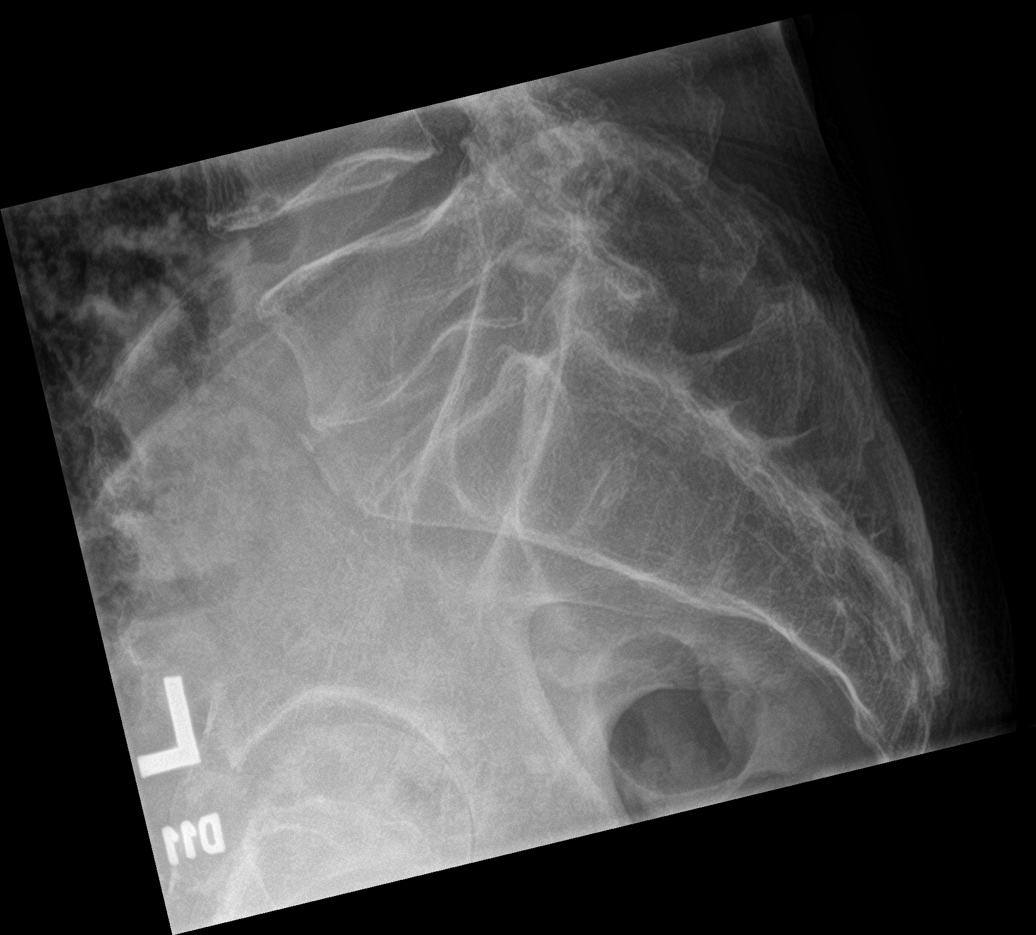

[5 of 5 positions shown; findings below may reference images not displayed]

FINDINGS: Subtle curvature of the lumbar spine convex left. Vertebral body
heights are within normal. Mild spondylosis throughout the lumbar
spine to include moderate facet arthropathy. Disc space heights are
maintained.
IMPRESSION: No acute findings.

Mild spondylosis throughout the lumbar spine.

## 2021-02-17 ENCOUNTER — Other Ambulatory Visit: Payer: Self-pay

## 2021-02-17 ENCOUNTER — Emergency Department (HOSPITAL_COMMUNITY)
Admission: EM | Admit: 2021-02-17 | Discharge: 2021-02-17 | Disposition: A | Discharge status: Home / Self Care | Payer: Medicare Other | Attending: Emergency Medicine | Admitting: Emergency Medicine

## 2021-02-17 ENCOUNTER — Encounter (HOSPITAL_COMMUNITY): Payer: Self-pay

## 2021-02-17 DIAGNOSIS — R220 Localized swelling, mass and lump, head: Secondary | ICD-10-CM | POA: Diagnosis not present

## 2021-02-17 DIAGNOSIS — Z7982 Long term (current) use of aspirin: Secondary | ICD-10-CM | POA: Insufficient documentation

## 2021-02-17 DIAGNOSIS — I10 Essential (primary) hypertension: Secondary | ICD-10-CM | POA: Insufficient documentation

## 2021-02-17 DIAGNOSIS — F17210 Nicotine dependence, cigarettes, uncomplicated: Secondary | ICD-10-CM | POA: Insufficient documentation

## 2021-02-17 DIAGNOSIS — Z79899 Other long term (current) drug therapy: Secondary | ICD-10-CM | POA: Insufficient documentation

## 2021-02-17 MED ORDER — PENICILLIN V POTASSIUM 500 MG PO TABS: 500.0000 mg | ORAL_TABLET | Freq: Four times a day (QID) | ORAL | 0 refills | Status: AC

## 2021-02-17 NOTE — ED Triage Notes (Signed)
Pt states he had a tooth pulled on top left on Friday, woke up this morning and both lips were swollen.

## 2021-02-17 NOTE — ED Provider Notes (Signed)
[facility] EMERGENCY DEPARTMENT Provider Note   CSN: [id number] Arrival date & time: 02/17/21  5176     History Chief Complaint  Patient presents with  . Oral Swelling    Dennis Ruiz is a 80 y.o. male.  80 year old male with complaint of upper lip swelling and mouth pain onset last night.  Patient reports extraction of left central incisor 2 days ago by his dentist.  Reports using salt water rinses last night for his pain which seemed to help however woke up this morning with swelling which prompted him to come to emergency room.  Denies fever, drainage from the area.  Not currently on antibiotics.  No other complaints or concerns.        Past Medical History:  Diagnosis Date  . Back pain   . Chest pain   . Hypertension     Patient Active Problem List   Diagnosis Date Noted  . Left varicocele 04/10/2015  . ED (erectile dysfunction) 07/27/2012  . History of BPH 07/27/2012  . Incomplete emptying of bladder 07/27/2012  . Increased frequency of urination 07/27/2012  . Facial cellulitis 03/01/2012  . Benign hypertension 03/01/2012  . BACK PAIN 04/17/2009  . CHEST PAIN 04/17/2009    Past Surgical History:  Procedure Laterality Date  . HERNIA REPAIR    . TRANSURETHRAL RESECTION OF PROSTATE         Family History  Problem Relation Age of Onset  . Cancer Father     Social History   Tobacco Use  . Smoking status: Current Every Day Smoker    Packs/day: 1.00    Types: Cigarettes  . Smokeless tobacco: Never Used  . Tobacco comment: pt reprots pack every two weeks.  Vaping Use  . Vaping Use: Never used  Substance Use Topics  . Alcohol use: Yes    Comment: socail  . Drug use: No    Home Medications Prior to Admission medications   Medication Sig Start Date End Date Taking? Authorizing Provider  penicillin v potassium (VEETID) 500 MG tablet Take 1 tablet (500 mg total) by mouth 4 (four) times daily for 7 days. 02/17/21 02/24/21 Yes Jeannie Fend, PA-C   aspirin EC 81 MG tablet Take 81 mg by mouth every morning.    [provider]  Cholecalciferol (VITAMIN D PO) Take 2 tablets by mouth daily. 10,000IUs daily    [provider]  famotidine (PEPCID) 20 MG tablet Take 20 mg by mouth at bedtime.    [provider]  HYDROcodone-acetaminophen (NORCO) 7.5-325 MG tablet Take 0.5 tablets by mouth every 6 (six) hours as needed for moderate pain.     [provider]  Multiple Vitamins-Minerals (ICAPS PO) Take 1 capsule by mouth daily.    [provider]  tamsulosin (FLOMAX) 0.4 MG CAPS capsule Take 1 capsule (0.4 mg total) by mouth daily. 01/07/21   Elenore Paddy, NP  temazepam (RESTORIL) 30 MG capsule Take 30 mg by mouth at bedtime as needed for sleep.  09/05/15   [provider]  triamterene-hydrochlorothiazide (MAXZIDE-25) 37.5-25 MG tablet Take 1 tablet by mouth daily. 01/07/21   Elenore Paddy, NP  VITAMIN E PO Take 1 tablet by mouth every morning.    [provider]    Allergies    Patient has no known allergies.  Review of Systems   Review of Systems  Constitutional: Negative for fever.  HENT: Positive for dental problem and facial swelling. Negative for ear pain,  trouble swallowing and voice change.   Gastrointestinal: Negative for nausea and vomiting.  Musculoskeletal: Negative for neck pain.  Skin: Negative for rash and wound.  Allergic/Immunologic: Negative for immunocompromised state.  Neurological: Negative for headaches.  Hematological: Negative for adenopathy.  Psychiatric/Behavioral: Negative for confusion.  All other systems reviewed and are negative.   Physical Exam Updated Vital Signs BP 120/87 (BP Location: Left Arm)   Pulse 64   Temp 98 F (36.7 C) (Oral)   Resp 16   Ht 6\' 3"  (1.905 m)   Wt 81.6 kg   SpO2 98%   BMI 22.50 kg/m   Physical Exam Vitals and nursing note reviewed.  Constitutional:      General: He is not in acute distress.    Appearance: He  is well-developed. He is not diaphoretic.  HENT:     Head: Normocephalic and atraumatic.     Jaw: No trismus.     Mouth/Throat:     Mouth: Mucous membranes are moist.   Eyes:     Conjunctiva/sclera: Conjunctivae normal.  Pulmonary:     Effort: Pulmonary effort is normal.  Musculoskeletal:     Cervical back: Neck supple.  Lymphadenopathy:     Cervical: No cervical adenopathy.  Skin:    General: Skin is warm and dry.     Findings: No erythema or rash.  Neurological:     Mental Status: He is alert and oriented to person, place, and time.  Psychiatric:        Behavior: Behavior normal.     ED Results / Procedures / Treatments   Labs (all labs ordered are listed, but only abnormal results are displayed) Labs Reviewed - No data to display  EKG None  Radiology No results found.  Procedures Procedures   Medications Ordered in ED Medications - No data to display  ED Course  I have reviewed the triage vital signs and the nursing notes.  Pertinent labs & imaging results that were available during my care of the patient were reviewed by me and considered in my medical decision making (see chart for details).  Clinical Course as of 02/17/21 0945  04/19/21 Feb 17, 2021  7939 80 year old male with local swelling post dental extraction 2 days ago.  Plan is to cover with penicillin, continue salt water rinses and follow-up with his dentist tomorrow. [LM]    Clinical Course User Index [LM] 94   MDM Rules/Calculators/A&P                          Final Clinical Impression(s) / ED Diagnoses Final diagnoses:  Mouth swelling    Rx / DC Orders ED Discharge Orders         Ordered    penicillin v potassium (VEETID) 500 MG tablet  4 times daily        02/17/21 0942           04/19/21, PA-C 02/17/21 0945    04/19/21, MD 02/17/21 1750

## 2021-02-17 NOTE — Discharge Instructions (Addendum)
Follow up with your dentist tomorrow. Take Penicillin as prescribed. Continue with salt water rinses.

## 2021-02-20 ENCOUNTER — Ambulatory Visit (INDEPENDENT_AMBULATORY_CARE_PROVIDER_SITE_OTHER): Payer: Medicare Other | Admitting: Nurse Practitioner

## 2021-02-21 ENCOUNTER — Ambulatory Visit (INDEPENDENT_AMBULATORY_CARE_PROVIDER_SITE_OTHER): Payer: Medicare Other | Admitting: Nurse Practitioner

## 2021-02-21 ENCOUNTER — Other Ambulatory Visit: Payer: Self-pay

## 2021-02-21 ENCOUNTER — Encounter (INDEPENDENT_AMBULATORY_CARE_PROVIDER_SITE_OTHER): Payer: Self-pay | Admitting: Nurse Practitioner

## 2021-02-21 ENCOUNTER — Telehealth (INDEPENDENT_AMBULATORY_CARE_PROVIDER_SITE_OTHER): Payer: Self-pay | Admitting: Nurse Practitioner

## 2021-02-21 VITALS — BP 118/76 | HR 88 | Temp 97.7°F | Ht 74.0 in | Wt 185.4 lb

## 2021-02-21 DIAGNOSIS — I10 Essential (primary) hypertension: Secondary | ICD-10-CM | POA: Diagnosis not present

## 2021-02-21 DIAGNOSIS — R350 Frequency of micturition: Secondary | ICD-10-CM | POA: Diagnosis not present

## 2021-02-21 DIAGNOSIS — N1831 Chronic kidney disease, stage 3a: Secondary | ICD-10-CM | POA: Diagnosis not present

## 2021-02-21 NOTE — Progress Notes (Signed)
Subjective:  Patient ID: Dennis Ruiz, male    DOB: 1941/04/27  Age: 80 y.o. MRN: [id number]  CC:  Chief Complaint  Patient presents with  . Hospitalization Follow-up    Had an infected tooth from a tooth removal  . Other    Urinary frequency  . Hypertension  . Chronic Kidney Disease      HPI  This patient arrives today for the above.  Urinary frequency: We trialed tamsulosin since last visit for treatment of nocturia.  He tells me he has seen some very mild improvement but not much.  Last PSA was 0.9.  He tells me he did have a TURP about 10 years ago.  He has been referred to urology and has an appointment scheduled next month.  Hypertension: He continues on triamterene-hydrochlorothiazide.  He is tolerating his medication well.  Chronic kidney disease: Refer him to nephrology late last year, but he tells me he never did hear from them about getting this appointment scheduled.  He tells me he has had multiple people in his family die so for a while there he may not have been responsive to phone calls and messages.  He is wondering if he may miss a phone call.  Last blood work shows GFR of 54.  He also mentions that he is having a hard time maintaining his weight.  He tells me that he has a hard time meal planning and cooking due to his wife he used to do the cooking having had passed away earlier this year.  He is wondering what he can take to help maintain his nutrition and weight.  Past Medical History:  Diagnosis Date  . Back pain   . Chest pain   . Hypertension       Family History  Problem Relation Age of Onset  . Cancer Father     Social History   Social History Narrative   Widowed   No regular exercise   Social History   Tobacco Use  . Smoking status: Current Every Day Smoker    Packs/day: 1.00    Types: Cigarettes  . Smokeless tobacco: Never Used  . Tobacco comment: pt reprots pack every two weeks.  Substance Use Topics  . Alcohol use: Yes     Comment: socail     Current Meds  Medication Sig  . aspirin EC 81 MG tablet Take 81 mg by mouth every morning.  . Cholecalciferol (VITAMIN D PO) Take 2 tablets by mouth daily. 10,000IUs daily  . famotidine (PEPCID) 20 MG tablet Take 20 mg by mouth at bedtime.  Marland Kitchen HYDROcodone-acetaminophen (NORCO) 7.5-325 MG tablet Take 0.5 tablets by mouth every 6 (six) hours as needed for moderate pain.   . Multiple Vitamins-Minerals (ICAPS PO) Take 1 capsule by mouth daily.  . penicillin v potassium (VEETID) 500 MG tablet Take 1 tablet (500 mg total) by mouth 4 (four) times daily for 7 days.  . tamsulosin (FLOMAX) 0.4 MG CAPS capsule Take 1 capsule (0.4 mg total) by mouth daily.  . temazepam (RESTORIL) 30 MG capsule Take 30 mg by mouth at bedtime as needed for sleep.   Marland Kitchen triamterene-hydrochlorothiazide (MAXZIDE-25) 37.5-25 MG tablet Take 1 tablet by mouth daily.  Marland Kitchen VITAMIN E PO Take 1 tablet by mouth every morning.    ROS:  Review of Systems  Constitutional: Positive for malaise/fatigue and weight loss. Negative for fever.  Eyes: Negative for blurred vision.  Respiratory: Negative for shortness of breath.  Cardiovascular: Negative for chest pain.  Gastrointestinal: Negative for blood in stool and melena.  Neurological: Negative for dizziness and headaches.     Objective:   Today's Vitals: BP 118/76   Pulse 88   Temp 97.7 F (36.5 C) (Temporal)   Ht 6\' 2"  (1.88 m)   Wt 185 lb 6.4 oz (84.1 kg)   SpO2 97%   BMI 23.80 kg/m  Vitals with BMI 02/21/2021 02/17/2021 01/07/2021  Height 6\' 2"  6\' 3"  6\' 2"   Weight 185 lbs 6 oz 180 lbs 188 lbs  BMI 23.79 22.5 24.13  Systolic 118 120 01/09/2021  Diastolic 76 87 78  Pulse 88 64 92     Physical Exam Vitals reviewed.  Constitutional:      Appearance: Normal appearance.  HENT:     Head: Normocephalic and atraumatic.  Cardiovascular:     Rate and Rhythm: Normal rate and regular rhythm.  Pulmonary:     Effort: Pulmonary effort is normal.     Breath  sounds: Normal breath sounds.  Musculoskeletal:     Cervical back: Neck supple.  Skin:    General: Skin is warm and dry.  Neurological:     Mental Status: He is alert and oriented to person, place, and time.  Psychiatric:        Mood and Affect: Mood normal.        Behavior: Behavior normal.        Thought Content: Thought content normal.        Judgment: Judgment normal.          Assessment and Plan   1. Urinary frequency   2. Benign hypertension   3. Stage 3a chronic kidney disease (HCC)      Plan: 1.  I think it is very reasonable that he follow-up with urology for further evaluation and I have encouraged him to keep this appointment as scheduled next month.  He plans on doing so. 2.  Blood pressure well controlled on current regimen we will keep him on his current medications as prescribed. 3.  We will look into nephrology referral to see if we can get him scheduled at the nephrologist office soon.  Of note, I recommended he try Nepro meal replacement shakes (about 1 shake twice a day in between meals), and we discussed the importance of nutrition especially eating a diet full of fresh vegetables, fruits, whole grains, legumes, and lean protein.  I encouraged him to consider the Mediterranean diet and have given him some information on this.   Tests ordered No orders of the defined types were placed in this encounter.     No orders of the defined types were placed in this encounter.   Patient to follow-up in 3 months or sooner as needed.  , NP

## 2021-02-21 NOTE — Patient Instructions (Addendum)
Nepro - meal replacement shake      Why follow it? Research shows. . Those who follow the Mediterranean diet have a reduced risk of heart disease  . The diet is associated with a reduced incidence of Parkinson's and Alzheimer's diseases . People following the diet may have longer life expectancies and lower rates of chronic diseases  . The Dietary Guidelines for Americans recommends the Mediterranean diet as an eating plan to promote health and prevent disease  What Is the Mediterranean Diet?  . Healthy eating plan based on typical foods and recipes of Mediterranean-style cooking . The diet is primarily a plant based diet; these foods should make up a majority of meals   Starches - Plant based foods should make up a majority of meals - They are an important sources of vitamins, minerals, energy, antioxidants, and fiber - Choose whole grains, foods high in fiber and minimally processed items  - Typical grain sources include wheat, oats, barley, corn, brown rice, bulgar, farro, millet, polenta, couscous  - Various types of beans include chickpeas, lentils, fava beans, black beans, white beans   Fruits  Veggies - Large quantities of antioxidant rich fruits & veggies; 6 or more servings  - Vegetables can be eaten raw or lightly drizzled with oil and cooked  - Vegetables common to the traditional Mediterranean Diet include: artichokes, arugula, beets, broccoli, brussel sprouts, cabbage, carrots, celery, collard greens, cucumbers, eggplant, kale, leeks, lemons, lettuce, mushrooms, okra, onions, peas, peppers, potatoes, pumpkin, radishes, rutabaga, shallots, spinach, sweet potatoes, turnips, zucchini - Fruits common to the Mediterranean Diet include: apples, apricots, avocados, cherries, clementines, dates, figs, grapefruits, grapes, melons, nectarines, oranges, peaches, pears, pomegranates, strawberries, tangerines  Fats - Replace butter and margarine with healthy oils, such as olive oil, canola oil,  and tahini  - Limit nuts to no more than a handful a day  - Nuts include walnuts, almonds, pecans, pistachios, pine nuts  - Limit or avoid candied, honey roasted or heavily salted nuts - Olives are central to the [employer] - can be eaten whole or used in a variety of dishes   Meats Protein - Limiting red meat: no more than a few times a month - When eating red meat: choose lean cuts and keep the portion to the size of deck of cards - Eggs: approx. 0 to 4 times a week  - Fish and lean poultry: at least 2 a week  - Healthy protein sources include, chicken, [place], lean beef, lamb - Increase intake of seafood such as tuna, salmon, trout, mackerel, shrimp, scallops - Avoid or limit high fat processed meats such as sausage and bacon  Dairy - Include moderate amounts of low fat dairy products  - Focus on healthy dairy such as fat free yogurt, skim milk, low or reduced fat cheese - Limit dairy products higher in fat such as whole or 2% milk, cheese, ice cream  Alcohol - Moderate amounts of red wine is ok  - No more than 5 oz daily for women (all ages) and men older than age 77  - No more than 10 oz of wine daily for men younger than 26  Other - Limit sweets and other desserts  - Use herbs and spices instead of salt to flavor foods  - Herbs and spices common to the traditional Mediterranean Diet include: basil, bay leaves, chives, cloves, cumin, fennel, garlic, lavender, marjoram, mint, oregano, parsley, pepper, rosemary, sage, savory, sumac, tarragon, thyme   It's not just a  diet, it's a lifestyle:  . The Mediterranean diet includes lifestyle factors typical of those in the region  . Foods, drinks and meals are best eaten with others and savored . Daily physical activity is important for overall good health . This could be strenuous exercise like running and aerobics . This could also be more leisurely activities such as walking, housework, yard-work, or taking the stairs . Moderation  is the key; a balanced and healthy diet accommodates most foods and drinks . Consider portion sizes and frequency of consumption of certain foods   Meal Ideas & Options:  . Breakfast:  o Whole wheat toast or whole wheat English muffins with peanut butter & hard boiled egg o Steel cut oats topped with apples & cinnamon and skim milk  o Fresh fruit: banana, strawberries, melon, berries, peaches  o Smoothies: strawberries, bananas, greek yogurt, peanut butter o Low fat greek yogurt with blueberries and granola  o Egg white omelet with spinach and mushrooms o Breakfast couscous: whole wheat couscous, apricots, skim milk, cranberries  . Sandwiches:  o Hummus and grilled vegetables (peppers, zucchini, squash) on whole wheat bread   o Grilled chicken on whole wheat pita with lettuce, tomatoes, cucumbers or tzatziki  o Tuna salad on whole wheat bread: tuna salad made with greek yogurt, olives, red peppers, capers, green onions o Garlic rosemary lamb pita: lamb sauted with garlic, rosemary, salt & pepper; add lettuce, cucumber, greek yogurt to pita - flavor with lemon juice and black pepper  . Seafood:  o Mediterranean grilled salmon, seasoned with garlic, basil, parsley, lemon juice and black pepper o Shrimp, lemon, and spinach whole-grain pasta salad made with low fat greek yogurt  o Seared scallops with lemon orzo  o Seared tuna steaks seasoned salt, pepper, coriander topped with tomato mixture of olives, tomatoes, olive oil, minced garlic, parsley, green onions and cappers  . Meats:  o Herbed greek chicken salad with kalamata olives, cucumber, feta  o Red bell peppers stuffed with spinach, bulgur, lean ground beef (or lentils) & topped with feta   o Kebabs: skewers of chicken, tomatoes, onions, zucchini, squash  o [place] burgers: made with red onions, mint, dill, lemon juice, feta cheese topped with roasted red peppers . Vegetarian o Cucumber salad: cucumbers, artichoke hearts, celery, red  onion, feta cheese, tossed in olive oil & lemon juice  o Hummus and whole grain pita points with a greek salad (lettuce, tomato, feta, olives, cucumbers, red onion) o Lentil soup with celery, carrots made with vegetable broth, garlic, salt and pepper  o Tabouli salad: parsley, bulgur, mint, scallions, cucumbers, tomato, radishes, lemon juice, olive oil, salt and pepper.

## 2021-02-21 NOTE — Telephone Encounter (Signed)
Please look into referral to nephrology referral from 09/2020. He tells me he hasn't heard from their office yet. He tells me he has had multiple deaths in the family this year so he may have missed their phone call.

## 2021-02-25 NOTE — Telephone Encounter (Signed)
Sure will look into also. So loooks like it was sent, but no contact from [facility] staff. Resent today again. HIM Release:  [id number]

## 2021-02-25 NOTE — Telephone Encounter (Signed)
Thank you :)

## 2021-03-21 ENCOUNTER — Encounter: Payer: Self-pay | Admitting: Urology

## 2021-03-21 ENCOUNTER — Ambulatory Visit (INDEPENDENT_AMBULATORY_CARE_PROVIDER_SITE_OTHER): Payer: Medicare Other | Admitting: Urology

## 2021-03-21 ENCOUNTER — Other Ambulatory Visit: Payer: Self-pay

## 2021-03-21 VITALS — BP 119/79 | HR 89 | Temp 98.2°F | Ht 74.0 in | Wt 183.8 lb

## 2021-03-21 DIAGNOSIS — R339 Retention of urine, unspecified: Secondary | ICD-10-CM

## 2021-03-21 DIAGNOSIS — R1032 Left lower quadrant pain: Secondary | ICD-10-CM | POA: Diagnosis not present

## 2021-03-21 DIAGNOSIS — G8929 Other chronic pain: Secondary | ICD-10-CM

## 2021-03-21 DIAGNOSIS — R351 Nocturia: Secondary | ICD-10-CM

## 2021-03-21 DIAGNOSIS — R3915 Urgency of urination: Secondary | ICD-10-CM | POA: Diagnosis not present

## 2021-03-21 DIAGNOSIS — I861 Scrotal varices: Secondary | ICD-10-CM

## 2021-03-21 LAB — URINALYSIS, ROUTINE W REFLEX MICROSCOPIC
Bilirubin, UA: NEGATIVE
Glucose, UA: NEGATIVE
Ketones, UA: NEGATIVE
Leukocytes,UA: NEGATIVE
Nitrite, UA: NEGATIVE
Protein,UA: NEGATIVE
RBC, UA: NEGATIVE
Specific Gravity, UA: 1.02 (ref 1.005–1.030)
Urobilinogen, Ur: 0.2 mg/dL (ref 0.2–1.0)
pH, UA: 6.5 (ref 5.0–7.5)

## 2021-03-21 MED ORDER — MIRABEGRON ER 25 MG PO TB24: 25.0000 mg | ORAL_TABLET | Freq: Every day | ORAL | 0 refills | Status: DC

## 2021-03-21 NOTE — Progress Notes (Signed)
Subjective: 1. Incomplete emptying of bladder   2. Nocturia   3. Urgency of urination   4. Groin pain, chronic, left   5. Left varicocele      Consult requested by Dr. Lilly Cove  Dennis Ruiz is an 80 yo male who had a TURP at [facility] 10 years ago and was doing well until about 5 months ago when he began to have nocturia x 3 with urgency with rare UUI.  He has some frequency.  He has some intermittency and a sensation of incomplete emptying.  His PVR today is 25ml.  He has no dysuria or hematuria.  He has some chronic left testicular and groin pain.  He was given flomax but hasn't started that yet.   His UA is clear today.  ROS:  ROS  No Known Allergies  Past Medical History:  Diagnosis Date  . Anxiety   . Back pain   . Chest pain   . Heart burn   . Heart disease   . Hypertension     Past Surgical History:  Procedure Laterality Date  . HERNIA REPAIR    . TRANSURETHRAL RESECTION OF PROSTATE      Social History   Socioeconomic History  . Marital status: Married    Spouse name: Not on file  . Number of children: Not on file  . Years of education: Not on file  . Highest education level: Not on file  Occupational History  . Not on file  Tobacco Use  . Smoking status: Current Every Day Smoker    Packs/day: 1.00    Types: Cigarettes  . Smokeless tobacco: Never Used  . Tobacco comment: pt reprots pack every two weeks.  Vaping Use  . Vaping Use: Never used  Substance and Sexual Activity  . Alcohol use: Yes    Comment: socail  . Drug use: No  . Sexual activity: Not on file  Other Topics Concern  . Not on file  Social History Narrative   Widowed   No regular exercise   Social Determinants of Health   Financial Resource Strain: Not on file  Food Insecurity: Not on file  Transportation Needs: Not on file  Physical Activity: Not on file  Stress: Not on file  Social Connections: Not on file  Intimate Partner Violence: Not on file    Family History  Problem  Relation Age of Onset  . Cancer Father     Anti-infectives: Anti-infectives (From admission, onward)   None      Current Outpatient Medications  Medication Sig Dispense Refill  . aspirin EC 81 MG tablet Take 81 mg by mouth every morning.    . Cholecalciferol (VITAMIN D PO) Take 2 tablets by mouth daily. 10,000IUs daily    . famotidine (PEPCID) 20 MG tablet Take 20 mg by mouth at bedtime.    Marland Kitchen HYDROcodone-acetaminophen (NORCO) 7.5-325 MG tablet Take 0.5 tablets by mouth every 6 (six) hours as needed for moderate pain.     . mirabegron ER (MYRBETRIQ) 25 MG TB24 tablet Take 1 tablet (25 mg total) by mouth daily. start with the 25 mg and increase to 50 mg if needed 28 tablet 0  . Multiple Vitamins-Minerals (ICAPS PO) Take 1 capsule by mouth daily.    . tamsulosin (FLOMAX) 0.4 MG CAPS capsule Take 1 capsule (0.4 mg total) by mouth daily. 30 capsule 3  . temazepam (RESTORIL) 30 MG capsule Take 30 mg by mouth at bedtime as needed for sleep.     Marland Kitchen  triamterene-hydrochlorothiazide (MAXZIDE-25) 37.5-25 MG tablet Take 1 tablet by mouth daily. 90 tablet 3  . VITAMIN E PO Take 1 tablet by mouth every morning.     No current facility-administered medications for this visit.     Objective: Vital signs in last 24 hours: BP 119/79   Pulse 89   Temp 98.2 F (36.8 C) (Oral)   Ht 6\' 2"  (1.88 m)   Wt 183 lb 12.8 oz (83.4 kg)   BMI 23.60 kg/m   Intake/Output from previous day: No intake/output data recorded. Intake/Output this shift: @IOTHISSHIFT @   Physical Exam Vitals reviewed.  Constitutional:      Appearance: Normal appearance.  Abdominal:     General: Abdomen is flat.     Palpations: Abdomen is soft.     Tenderness: There is no abdominal tenderness.     Hernia: No hernia is present.     Comments: Left inguinal scar with some inguinal tenderness.  No recurrent hernia.   Genitourinary:    Comments: Grade 2 left varicocele.  Scrotum O/W normal. Testes slightly atrophied without  nodules. Epididymis normal. AP without lesions. Prostate small and benign. SV non-palpable.  Musculoskeletal:        General: No swelling or tenderness. Normal range of motion.  Skin:    General: Skin is warm and dry.  Neurological:     General: No focal deficit present.     Mental Status: He is alert and oriented to person, place, and time.     Lab Results:  No results found for this or any previous visit (from the past 24 hour(s)).  BMET No results for input(s): NA, K, CL, CO2, GLUCOSE, BUN, CREATININE, CALCIUM in the last 72 hours. PT/INR No results for input(s): LABPROT, INR in the last 72 hours. ABG No results for input(s): PHART, HCO3 in the last 72 hours.  Invalid input(s): PCO2, PO2 UA is clear.  Studies/Results: No results found. PVR is 31ml.   Referral notes reviewed.  Prior labs in Epic reviewed.   Assessment/Plan: Frequency, Urgency and nocturia.   He has a good flow and is emptying well so I don't think he needs tamsulosin.  I am going to try him on Myrbetriq 25mg  with an increase to 50mg  if needed.  Side effects and precautions reviewed.    He has a Grade 2 left varicocele and chronic groin pain but I believe the pain is probably secondary to the prior hernia repair and not the varicocele.   Meds ordered this encounter  Medications  . mirabegron ER (MYRBETRIQ) 25 MG TB24 tablet    Sig: Take 1 tablet (25 mg total) by mouth daily. start with the 25 mg and increase to 50 mg if needed    Dispense:  28 tablet    Refill:  0    28 25mg  and 28 50mg  samples given     Orders Placed This Encounter  Procedures  . Urinalysis, Routine w reflex microscopic  . BLADDER SCAN AMB NON-IMAGING     Return in about 4 weeks (around 04/18/2021) for 4-6 weeks for flowrate and possible cystoscopy. .    CC: Dr. 1m.      03/21/2021 ID: , male   DOB: Feb 18, 1941, 80 y.o.   MRN: Dennis Ruiz

## 2021-03-21 NOTE — Progress Notes (Signed)
Urological Symptom Review  Patient is experiencing the following symptoms: Frequent urination Hard to postpone urination Get up at night to urinate Leakage of urine Urinary tract infection Painful intercourse   Review of Systems  Gastrointestinal (upper)  : Negative for upper GI symptoms  Gastrointestinal (lower) : Negative for lower GI symptoms  Constitutional : Negative for symptoms  Skin: Negative for skin symptoms  Eyes: Negative for eye symptoms  Ear/Nose/Throat : Negative for Ear/Nose/Throat symptoms  Hematologic/Lymphatic: Negative for Hematologic/Lymphatic symptoms  Cardiovascular : Negative for cardiovascular symptoms  Respiratory : Negative for respiratory symptoms  Endocrine: Negative for endocrine symptoms  Musculoskeletal: Negative for musculoskeletal symptoms  Neurological: Negative for neurological symptoms  Psychologic: Negative for psychiatric symptoms

## 2021-03-26 ENCOUNTER — Telehealth (INDEPENDENT_AMBULATORY_CARE_PROVIDER_SITE_OTHER): Payer: Self-pay | Admitting: Internal Medicine

## 2021-03-26 NOTE — Telephone Encounter (Signed)
Left message for patient to call back and schedule Medicare Annual Wellness Visit (AWV) either virtually or in office.   AWVI 10/20/09 per [facility]  please schedule at anytime with [facility] health coach  This should be a 45 minute visit.

## 2021-04-09 ENCOUNTER — Emergency Department (HOSPITAL_COMMUNITY)
Admission: EM | Admit: 2021-04-09 | Discharge: 2021-04-09 | Disposition: A | Discharge status: Home / Self Care | Payer: Medicare Other | Attending: Emergency Medicine | Admitting: Emergency Medicine

## 2021-04-09 ENCOUNTER — Encounter (HOSPITAL_COMMUNITY): Payer: Self-pay | Admitting: *Deleted

## 2021-04-09 ENCOUNTER — Other Ambulatory Visit: Payer: Self-pay

## 2021-04-09 DIAGNOSIS — R21 Rash and other nonspecific skin eruption: Secondary | ICD-10-CM | POA: Diagnosis not present

## 2021-04-09 DIAGNOSIS — I10 Essential (primary) hypertension: Secondary | ICD-10-CM | POA: Diagnosis not present

## 2021-04-09 DIAGNOSIS — Z79899 Other long term (current) drug therapy: Secondary | ICD-10-CM | POA: Insufficient documentation

## 2021-04-09 DIAGNOSIS — S30860A Insect bite (nonvenomous) of lower back and pelvis, initial encounter: Secondary | ICD-10-CM | POA: Diagnosis not present

## 2021-04-09 DIAGNOSIS — W57XXXA Bitten or stung by nonvenomous insect and other nonvenomous arthropods, initial encounter: Secondary | ICD-10-CM | POA: Diagnosis not present

## 2021-04-09 DIAGNOSIS — Z7982 Long term (current) use of aspirin: Secondary | ICD-10-CM | POA: Insufficient documentation

## 2021-04-09 DIAGNOSIS — F17210 Nicotine dependence, cigarettes, uncomplicated: Secondary | ICD-10-CM | POA: Diagnosis not present

## 2021-04-09 MED ORDER — DIPHENHYDRAMINE HCL 25 MG PO TABS: 25.0000 mg | ORAL_TABLET | Freq: Four times a day (QID) | ORAL | 0 refills | Status: AC

## 2021-04-09 MED ORDER — DIPHENHYDRAMINE HCL 25 MG PO CAPS
25.0000 mg | ORAL_CAPSULE | Freq: Once | ORAL | Status: AC
  Administered 2021-04-09: 25 mg via ORAL
  Filled 2021-04-09: qty 1

## 2021-04-09 NOTE — ED Triage Notes (Signed)
Pt states itching from waist down for over 2 days.  Pt does not know what is causing it.

## 2021-04-09 NOTE — ED Provider Notes (Signed)
[facility] EMERGENCY DEPARTMENT Provider Note   CSN: [id number] Arrival date & time: 04/09/21  1358     History No chief complaint on file.   Dennis Ruiz is a 80 y.o. male with history of hypertension, back pain, presenting for evaluation with 2 day history of a pruritic rash.  He woke with red spots on his mostly legs and groin region with another spot on his back, suspects he may have been bit by an insect but was unable to identify the culprit.  He denies pain at the sites but is very itchy.  He has had no fevers or chills, denies cough or shortness of breath, there have been no drainage from the wound sites.  He has not been outdoors or working in the yard.  He he states he treats his house every 6 months for bedbugs and has never seen a bedbug in his home, nor is he identified any other insects.  He has used a peppermint oil mixed with Vaseline for topical itch relief which is semieffective.  He has found no other alleviators for his itch symptom.  The history is provided by the patient.      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/2015   ED (erectile dysfunction) 07/27/2012   History of BPH 07/27/2012   Incomplete emptying of bladder 07/27/2012   Increased frequency of urination 07/27/2012   Facial cellulitis 03/01/2012   Benign hypertension 03/01/2012   BACK PAIN 04/17/2009   CHEST PAIN 04/17/2009    Past Surgical History:  Procedure Laterality Date   HERNIA REPAIR     TRANSURETHRAL RESECTION OF PROSTATE         Family History  Problem Relation Age of Onset   Cancer Father     Social History   Tobacco Use   Smoking status: Every Day    Packs/day: 1.00    Pack years: 0.00    Types: Cigarettes   Smokeless tobacco: Never   Tobacco comments:    pt reprots pack every two weeks.  Vaping Use    Vaping Use: Never used  Substance Use Topics   Alcohol use: Yes    Comment: socail   Drug use: No    Home Medications Prior to Admission medications   Medication Sig Start Date End Date Taking? Authorizing Provider  diphenhydrAMINE (BENADRYL) 25 MG tablet Take 1 tablet (25 mg total) by mouth every 6 (six) hours. 04/09/21  Yes Kesia Dalto, Raynelle Fanning, PA-C  aspirin EC 81 MG tablet Take 81 mg by mouth every morning.    [provider]  Cholecalciferol (VITAMIN D PO) Take 2 tablets by mouth daily. 10,000IUs daily    [provider]  famotidine (PEPCID) 20 MG tablet Take 20 mg by mouth at bedtime.    [provider]  HYDROcodone-acetaminophen (NORCO) 7.5-325 MG tablet Take 0.5 tablets by mouth every 6 (six) hours as needed for moderate pain.     [provider]  mirabegron ER (MYRBETRIQ) 25 MG TB24 tablet Take 1 tablet (25 mg total) by mouth daily. start with the 25 mg and increase to 50 mg if needed 03/21/21   Bjorn Pippin, MD  Multiple Vitamins-Minerals (ICAPS PO) Take 1 capsule by mouth daily.    [provider]  tamsulosin (FLOMAX) 0.4 MG CAPS capsule Take 1 capsule (0.4 mg total) by mouth daily. 01/07/21   Elenore Paddy, NP  temazepam (RESTORIL) 30 MG capsule Take 30 mg by mouth at bedtime as needed for sleep.  09/05/15   [provider]  triamterene-hydrochlorothiazide (MAXZIDE-25) 37.5-25 MG tablet Take 1 tablet by mouth daily. 01/07/21   Elenore Paddy, NP  VITAMIN E PO Take 1 tablet by mouth every morning.    [provider]    Allergies    Patient has no known allergies.  Review of Systems   Review of Systems  Constitutional:  Negative for chills and fever.  HENT:  Negative for facial swelling.   Respiratory:  Negative for cough, shortness of breath and wheezing.   Skin:  Positive for rash.  Neurological:  Negative for numbness.  All other systems reviewed and are negative.  Physical Exam Updated Vital Signs BP 118/83 (BP  Location: Right Arm)   Pulse 93   Temp 98.5 F (36.9 C) (Oral)   Resp 17   Ht 6\' 2"  (1.88 m)   Wt 83.5 kg   SpO2 97%   BMI 23.62 kg/m   Physical Exam Constitutional:      General: He is not in acute distress.    Appearance: He is well-developed.  HENT:     Head: Normocephalic.  Cardiovascular:     Rate and Rhythm: Normal rate.  Pulmonary:     Effort: Pulmonary effort is normal.     Breath sounds: No wheezing.  Musculoskeletal:        General: Normal range of motion.     Cervical back: Neck supple.  Skin:    Findings: Rash present.     Comments: 10-12 scattered erythematous papules, most proximal being on his left lower back, the most distal is at his bilateral upper calves.  Approximately 8 to 10 mm in diameter, there is a subtle central punctum at each site, no pustules, no drainage, no surrounding erythema or red streaking.       ED Results / Procedures / Treatments   Labs (all labs ordered are listed, but only abnormal results are displayed) Labs Reviewed - No data to display  EKG None  Radiology No results found.  Procedures Procedures   Medications Ordered in ED Medications  diphenhydrAMINE (BENADRYL) capsule 25 mg (has no administration in time range)    ED Course  I have reviewed the triage vital signs and the nursing notes.  Pertinent labs & imaging results that were available during my care of the patient were reviewed by me and considered in my medical decision making (see chart for details).    MDM Rules/Calculators/A&P                          Exam and history suggest insect bites.  No sign of infection at these sites.  Pt was started on benadryl for itch, advised can continue to use his peppermint oil, or try gold bond anti itch cream which can also be effective for itching. Plan pcp f/u for any problems or concerns.  Final Clinical Impression(s) / ED Diagnoses Final diagnoses:  Insect bite, unspecified site, initial encounter    Rx / DC  Orders ED Discharge Orders          Ordered    diphenhydrAMINE (BENADRYL) 25 MG tablet  Every 6 hours        04/09/21 1445  Burgess Amor, PA-C 04/09/21 1459    Jacalyn Lefevre, MD 04/09/21 [phone]

## 2021-04-09 NOTE — Discharge Instructions (Addendum)
These spots look like insect bites.  You can continue using your peppermint salve for itch relief, but if you need a stronger medicine - you can try Gold Bond Anti Itch cream (or your stores generic version - cheaper!)  to these sites.  You may take the benadryl prescribed as well - use caution as this can cause drowsiness.

## 2021-04-27 ENCOUNTER — Emergency Department (HOSPITAL_COMMUNITY)
Admission: EM | Admit: 2021-04-27 | Discharge: 2021-04-27 | Disposition: A | Discharge status: Home / Self Care | Payer: Medicare Other | Attending: Emergency Medicine | Admitting: Emergency Medicine

## 2021-04-27 ENCOUNTER — Other Ambulatory Visit: Payer: Self-pay

## 2021-04-27 ENCOUNTER — Encounter (HOSPITAL_COMMUNITY): Payer: Self-pay | Admitting: *Deleted

## 2021-04-27 DIAGNOSIS — Z7982 Long term (current) use of aspirin: Secondary | ICD-10-CM | POA: Insufficient documentation

## 2021-04-27 DIAGNOSIS — F17210 Nicotine dependence, cigarettes, uncomplicated: Secondary | ICD-10-CM | POA: Diagnosis not present

## 2021-04-27 DIAGNOSIS — M79602 Pain in left arm: Secondary | ICD-10-CM | POA: Diagnosis not present

## 2021-04-27 DIAGNOSIS — I10 Essential (primary) hypertension: Secondary | ICD-10-CM | POA: Diagnosis not present

## 2021-04-27 NOTE — ED Triage Notes (Signed)
Pt c/o a "heartbeat" feeling in his left arm; pt states the pain is intermittent and no other sx associated with the pain

## 2021-04-27 NOTE — ED Provider Notes (Signed)
[facility] EMERGENCY DEPARTMENT Provider Note   CSN: [id number] Arrival date & time: 04/27/21  1958     History Chief Complaint  Patient presents with   Arm Pain    Dennis Ruiz is a 80 y.o. male.  HPI  Patient with significant medical history of hypertension presents with chief complaint of left arm pain.  Patient states pain started a few days ago, states he has pain in the left AC, pain comes and goes, he denies paresthesia or weakness in his left upper extremity, he denies  alleviating or aggravating factors.  Patient states pain comes on randomly and then will disappear on its own, he denies  recent trauma to the area, states he  never had  pain like this in the past.  He denies systemic infection like fevers or chills, denies IV drug use, he describes the pain as a throbbing-like sensation, states he is not taken any medication for this.  He denies chest pain, shortness of breath, denies pain rating to his left shoulder or neck, he denies illicit drug use, has no cardiac history.  He has no other complaints at this time.  He is endorse abdominal pain, nausea, vomiting, worsening pedal edema.  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/2015   ED (erectile dysfunction) 07/27/2012   History of BPH 07/27/2012   Incomplete emptying of bladder 07/27/2012   Increased frequency of urination 07/27/2012   Facial cellulitis 03/01/2012   Benign hypertension 03/01/2012   BACK PAIN 04/17/2009   CHEST PAIN 04/17/2009    Past Surgical History:  Procedure Laterality Date   HERNIA REPAIR     TRANSURETHRAL RESECTION OF PROSTATE         Family History  Problem Relation Age of Onset   Cancer Father     Social History   Tobacco Use   Smoking status: Every Day    Packs/day: 1.00    Pack years: 0.00    Types:  Cigarettes   Smokeless tobacco: Never   Tobacco comments:    pt reprots pack every two weeks.  Vaping Use   Vaping Use: Never used  Substance Use Topics   Alcohol use: Yes    Comment: socail   Drug use: No    Home Medications Prior to Admission medications   Medication Sig Start Date End Date Taking? Authorizing Provider  aspirin EC 81 MG tablet Take 81 mg by mouth every morning.    [provider]  Cholecalciferol (VITAMIN D PO) Take 2 tablets by mouth daily. 10,000IUs daily    [provider]  diphenhydrAMINE (BENADRYL) 25 MG tablet Take 1 tablet (25 mg total) by mouth every 6 (six) hours. 04/09/21   Burgess Amor, PA-C  famotidine (PEPCID) 20 MG tablet Take 20 mg by mouth at bedtime.    [provider]  HYDROcodone-acetaminophen (NORCO) 7.5-325 MG tablet Take 0.5 tablets by mouth every 6 (six) hours as needed for moderate pain.     [provider]  mirabegron ER (MYRBETRIQ) 25 MG TB24 tablet Take 1 tablet (25 mg total) by mouth daily. start with the 25 mg and increase to 50 mg if needed 03/21/21   Bjorn Pippin, MD  Multiple Vitamins-Minerals (ICAPS PO) Take 1 capsule by  mouth daily.    [provider]  tamsulosin (FLOMAX) 0.4 MG CAPS capsule Take 1 capsule (0.4 mg total) by mouth daily. 01/07/21   Elenore Paddy, NP  temazepam (RESTORIL) 30 MG capsule Take 30 mg by mouth at bedtime as needed for sleep.  09/05/15   [provider]  triamterene-hydrochlorothiazide (MAXZIDE-25) 37.5-25 MG tablet Take 1 tablet by mouth daily. 01/07/21   Elenore Paddy, NP  VITAMIN E PO Take 1 tablet by mouth every morning.    [provider]    Allergies    Patient has no known allergies.  Review of Systems   Review of Systems  Constitutional:  Negative for chills and fever.  HENT:  Negative for congestion.   Respiratory:  Negative for shortness of breath.   Cardiovascular:  Negative for chest pain.  Gastrointestinal:  Negative for abdominal  pain.  Genitourinary:  Negative for enuresis.  Musculoskeletal:  Negative for back pain.       Left arm pain.   Skin:  Negative for rash.  Neurological:  Negative for dizziness.  Hematological:  Does not bruise/bleed easily.   Physical Exam Updated Vital Signs BP 118/73 (BP Location: Right Arm)   Pulse 85   Temp 98.4 F (36.9 C) (Oral)   Resp 18   Ht 6\' 2"  (1.88 m)   Wt 83.9 kg   SpO2 98%   BMI 23.75 kg/m   Physical Exam Vitals and nursing note reviewed.  Constitutional:      General: He is not in acute distress.    Appearance: He is not ill-appearing.  HENT:     Head: Normocephalic and atraumatic.     Nose: No congestion.  Eyes:     Conjunctiva/sclera: Conjunctivae normal.  Cardiovascular:     Rate and Rhythm: Normal rate and regular rhythm.     Pulses: Normal pulses.     Heart sounds: No murmur heard.   No friction rub. No gallop.  Pulmonary:     Effort: No respiratory distress.     Breath sounds: No wheezing, rhonchi or rales.  Musculoskeletal:     Comments: Left arm was visualized there is no gross deformities present, no erythema or edema around the [facility],  he has full range of motion in his fingers wrist and elbow and shoulder, neurovascular fully intact, left AC was palpated it was slightly tender to palpation, no fluctuance or induration noted.   Skin:    General: Skin is warm and dry.  Neurological:     Mental Status: He is alert.  Psychiatric:        Mood and Affect: Mood normal.    ED Results / Procedures / Treatments   Labs (all labs ordered are listed, but only abnormal results are displayed) Labs Reviewed - No data to display  EKG None  Radiology No results found.  Procedures Procedures   Medications Ordered in ED Medications - No data to display  ED Course  I have reviewed the triage vital signs and the nursing notes.  Pertinent labs & imaging results that were available during my care of the patient were reviewed by me and considered  in my medical decision making (see chart for details).    MDM Rules/Calculators/A&P                         Initial impression-patient presents with left arm pain.  He is alert, does not appear in distress, vital signs reassuring.  Will obtain EKG for further evaluation.  Work-up-EKG sinus without signs of ischemia.  Rule out- I have low suspicion for septic arthritis as patient denies IV drug use, skin exam was performed no erythematous, edematous, warm joints noted on exam, no new heart murmur heard on exam.  Low suspicion for fracture or dislocation as there is no gross deformities present my exam, will defer imaging at this time as there is no traumatic injury associated with this.  Low suspicion for ligament or tendon damage as area was palpated no gross defects noted,he has full range of motion as well as 5/5 strength.  Low suspicion for compartment syndrome as area was palpated it was soft to the touch, neurovascular fully intact.  Low suspicion for ACS as as he does have associated chest pain or shortness of breath, EKG shows no signs of ischemia, presentation more consistent with a muscular strain.   Plan-  Left arm pain-suspect muscular strain, will provide patient with NSAIDs, follow-up with PCP as needed.  Vital signs have remained stable, no indication for hospital admission.  Patient discussed with attending and they agreed with assessment and plan.  Patient given at home care as well strict return precautions.  Patient verbalized that they understood agreed to said plan.  Final Clinical Impression(s) / ED Diagnoses Final diagnoses:  Left arm pain    Rx / DC Orders ED Discharge Orders     None        Barnie Del 04/27/21 2212    Eber Hong, MD 04/28/21 [phone]

## 2021-04-27 NOTE — Discharge Instructions (Addendum)
You have been seen here for left arm pain. I recommend taking over-the-counter pain medications like ibuprofen and/or Tylenol every 6 as needed.  Please follow dosage and on the back of bottle.  I also recommend applying heat to the area and stretching out the muscles as this will help decrease stiffness and pain.    Please follow-up your PCP as needed.  Come back to the emergency department if you develop chest pain, shortness of breath, severe abdominal pain, uncontrolled nausea, vomiting, diarrhea.

## 2021-04-30 ENCOUNTER — Telehealth (INDEPENDENT_AMBULATORY_CARE_PROVIDER_SITE_OTHER): Payer: Self-pay

## 2021-04-30 NOTE — Telephone Encounter (Signed)
Transition Care Management Unsuccessful Follow-up Telephone Call  Date of discharge and from where:  [facility] @ 04/27/21 10:18 PM.   Attempts:  1st Attempt  Reason for unsuccessful TCM follow-up call:  Left voice message. To call office to f/u on the ER visit. To see if he is doing better or need to be seen by a orthopedic provider?

## 2021-05-23 ENCOUNTER — Other Ambulatory Visit: Payer: Self-pay

## 2021-05-23 ENCOUNTER — Ambulatory Visit (INDEPENDENT_AMBULATORY_CARE_PROVIDER_SITE_OTHER): Payer: Medicare Other | Admitting: Urology

## 2021-05-23 ENCOUNTER — Encounter: Payer: Self-pay | Admitting: Urology

## 2021-05-23 ENCOUNTER — Encounter (INDEPENDENT_AMBULATORY_CARE_PROVIDER_SITE_OTHER): Payer: Self-pay

## 2021-05-23 VITALS — BP 129/79 | HR 84

## 2021-05-23 DIAGNOSIS — R1032 Left lower quadrant pain: Secondary | ICD-10-CM | POA: Diagnosis not present

## 2021-05-23 DIAGNOSIS — R339 Retention of urine, unspecified: Secondary | ICD-10-CM

## 2021-05-23 DIAGNOSIS — R351 Nocturia: Secondary | ICD-10-CM | POA: Diagnosis not present

## 2021-05-23 LAB — BLADDER SCAN AMB NON-IMAGING: Scan Result: 167

## 2021-05-23 MED ORDER — MIRABEGRON ER 25 MG PO TB24: 25.0000 mg | ORAL_TABLET | Freq: Every day | ORAL | 1 refills | Status: DC

## 2021-05-23 NOTE — Progress Notes (Signed)
Subjective: 1. Nocturia   2. Incomplete emptying of bladder   3. Left groin pain      Consult requested by Dr. Lilly Ruiz  [name] is an 80 yo male who had a TURP at [facility] 10 years ago and was doing well until about 5 months ago when he began to have nocturia x 3 with urgency with rare UUI.  He has some frequency.  He has some intermittency and a sensation of incomplete emptying.  He was given myrbetriq and is using the 25mg  with success.  His IPSS is 6.  His nocturia is down to 1x.  He can have some urgency but no incontinence.  His PVR today is but he didn't feel like he needed to give a specimen.   .  He has no dysuria or hematuria.  He has some chronic left testicular and groin pain but it seems related to his left hip which is cracking with movement and has reduced mobility.  He was given flomax but didn't take that.  ROS:  ROS  No Known Allergies  Past Medical History:  Diagnosis Date   Anxiety    Back pain    Chest pain    Heart burn    Heart disease    Hypertension     Past Surgical History:  Procedure Laterality Date   HERNIA REPAIR     TRANSURETHRAL RESECTION OF PROSTATE      Social History   Socioeconomic History   Marital status: Married    Spouse name: Not on file   Number of children: Not on file   Years of education: Not on file   Highest education level: Not on file  Occupational History   Not on file  Tobacco Use   Smoking status: Every Day    Packs/day: 1.00    Types: Cigarettes   Smokeless tobacco: Never   Tobacco comments:    pt reprots pack every two weeks.  Vaping Use   Vaping Use: Never used  Substance and Sexual Activity   Alcohol use: Yes    Comment: socail   Drug use: No   Sexual activity: Not on file  Other Topics Concern   Not on file  Social History Narrative   Widowed   No regular exercise   Social Determinants of Health   Financial Resource Strain: Not on file  Food Insecurity: Not on file  Transportation  Needs: Not on file  Physical Activity: Not on file  Stress: Not on file  Social Connections: Not on file  Intimate Partner Violence: Not on file    Family History  Problem Relation Age of Onset   Cancer Father     Anti-infectives: Anti-infectives (From admission, onward)    None       Current Outpatient Medications  Medication Sig Dispense Refill   aspirin EC 81 MG tablet Take 81 mg by mouth every morning.     Cholecalciferol (VITAMIN D PO) Take 2 tablets by mouth daily. 10,000IUs daily     diphenhydrAMINE (BENADRYL) 25 MG tablet Take 1 tablet (25 mg total) by mouth every 6 (six) hours. 20 tablet 0   famotidine (PEPCID) 20 MG tablet Take 20 mg by mouth at bedtime.     HYDROcodone-acetaminophen (NORCO) 7.5-325 MG tablet Take 0.5 tablets by mouth every 6 (six) hours as needed for moderate pain.      Multiple Vitamins-Minerals (ICAPS PO) Take 1 capsule by mouth daily.     tamsulosin (FLOMAX) 0.4 MG  CAPS capsule Take 1 capsule (0.4 mg total) by mouth daily. 30 capsule 3   temazepam (RESTORIL) 30 MG capsule Take 30 mg by mouth at bedtime as needed for sleep.      triamterene-hydrochlorothiazide (MAXZIDE-25) 37.5-25 MG tablet Take 1 tablet by mouth daily. 90 tablet 3   VITAMIN E PO Take 1 tablet by mouth every morning.     mirabegron ER (MYRBETRIQ) 25 MG TB24 tablet Take 1 tablet (25 mg total) by mouth daily. start with the 25 mg and increase to 50 mg if needed 30 tablet 1   No current facility-administered medications for this visit.     Objective: Vital signs in last 24 hours: BP 129/79   Pulse 84   Intake/Output from previous day: No intake/output data recorded. Intake/Output this shift: @IOTHISSHIFT @   Physical Exam  Lab Results:  No results found for this or any previous visit (from the past 24 hour(s)).  BMET No results for input(s): NA, K, CL, CO2, GLUCOSE, BUN, CREATININE, CALCIUM in the last 72 hours. PT/INR No results for input(s): LABPROT, INR in the last  72 hours. ABG No results for input(s): PHART, HCO3 in the last 72 hours.  Invalid input(s): PCO2, PO2 UA is clear.  Studies/Results: No results found. PVR is .   Referral notes reviewed.  Prior labs in Epic reviewed.   Assessment/Plan: Frequency, Urgency and nocturia.   He is doing better on the myrbetriq 25mg  and will continue that med.   He has a Grade 2 left varicocele but I don't think that is the cause of the chronic groin pain that now seems to be related to his hip.   Meds ordered this encounter  Medications   mirabegron ER (MYRBETRIQ) 25 MG TB24 tablet    Sig: Take 1 tablet (25 mg total) by mouth daily. start with the 25 mg and increase to 50 mg if needed    Dispense:  30 tablet    Refill:  1    28 25mg  and 28 50mg  samples given     Orders Placed This Encounter  Procedures   AMB referral to orthopedics    Referral Priority:   Routine    Referral Type:   Consultation    Number of Visits Requested:   1   BLADDER SCAN AMB NON-IMAGING     Return in about 6 months (around 11/23/2021) for PVR.    CC: Dr. .      05/23/2021 01/21/2022 ID: Dennis Ruiz, male   DOB: 20-Aug-1941, 80 y.o.   MRN: [id number]

## 2021-05-23 NOTE — Progress Notes (Signed)
post void residual=167

## 2021-05-23 NOTE — Progress Notes (Signed)
Urological Symptom Review  Patient is experiencing the following symptoms: N?A   Review of Systems  Gastrointestinal (upper)  : Indigestion/heartburn  Gastrointestinal (lower) : Negative for lower GI symptoms  Constitutional : Weight loss  Skin: Negative for skin symptoms  Eyes: Blurred vision  Ear/Nose/Throat : Negative for Ear/Nose/Throat symptoms  Hematologic/Lymphatic: Negative for Hematologic/Lymphatic symptoms  Cardiovascular : Negative for cardiovascular symptoms  Respiratory : Negative for respiratory symptoms  Endocrine: Negative for endocrine symptoms  Musculoskeletal: Back pain Joint pain  Neurological: Headaches Dizziness  Psychologic: Depression

## 2021-06-17 ENCOUNTER — Ambulatory Visit (INDEPENDENT_AMBULATORY_CARE_PROVIDER_SITE_OTHER): Payer: Medicare Other | Admitting: Internal Medicine

## 2021-06-27 ENCOUNTER — Ambulatory Visit: Payer: Medicare Other | Admitting: Orthopedic Surgery

## 2021-07-04 ENCOUNTER — Ambulatory Visit: Payer: Medicare Other | Admitting: Orthopedic Surgery

## 2021-07-11 ENCOUNTER — Other Ambulatory Visit: Payer: Self-pay

## 2021-07-11 ENCOUNTER — Encounter: Payer: Self-pay | Admitting: Orthopedic Surgery

## 2021-07-11 ENCOUNTER — Ambulatory Visit: Payer: Medicare Other

## 2021-07-11 ENCOUNTER — Ambulatory Visit (INDEPENDENT_AMBULATORY_CARE_PROVIDER_SITE_OTHER): Payer: Medicare Other | Admitting: Orthopedic Surgery

## 2021-07-11 VITALS — BP 136/92 | HR 96 | Ht 75.0 in | Wt 192.0 lb

## 2021-07-11 DIAGNOSIS — M25552 Pain in left hip: Secondary | ICD-10-CM | POA: Diagnosis not present

## 2021-07-11 DIAGNOSIS — M1612 Unilateral primary osteoarthritis, left hip: Secondary | ICD-10-CM

## 2021-07-11 MED ORDER — MELOXICAM 7.5 MG PO TABS
7.5000 mg | ORAL_TABLET | Freq: Every day | ORAL | 5 refills | Status: DC
Start: 2021-07-11 — End: 2023-02-05

## 2021-07-11 NOTE — Progress Notes (Signed)
Chief Complaint  Patient presents with   Hip Pain    Lt side groin pain that radiates around to buttocks of and on for 3 months. Also c/o numbness in left foot.   80 year old male referred to [place] by Dr. Chauncy Lean who noticed the patient had crepitance pain left hip and was having difficulty walking  Patient reports left hip and groin pain rating from his back into his groin for the last 2 months with difficulty walking.  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  No treatment to date related to his left hip pain   Past Medical History:  Diagnosis Date   Anxiety    Back pain    Chest pain    Heart burn    Heart disease    Hypertension     Review of systems back pain varicocele no chest pain or shortness of breath   Physical exam  BP (!) 136/92   Pulse 96   Ht 6\' 3"  (1.905 m)   Wt 192 lb (87.1 kg)   BMI 24.00 kg/m   General appearance patient is well-developed well-nourished grooming and hygiene are normal  Distally cardiovascular exam no peripheral vascular disease no swelling or edema normal color capillary refill  Neuro exam is normal  Skin is intact  Left hip is painful to range of motion he does have flexion past 90 degrees he does have some crepitance decreased internal rotation leg lengths are equal  Imaging shows a arthritic left hip moderate to severe disease  Encounter Diagnoses  Name Primary?   Pain in left hip Yes   Arthritis of left hip    Dennis Ruiz opted for medical management he says he can pretty much manage it.  So we put him on some arthritis medicine  Meds ordered this encounter  Medications   meloxicam (MOBIC) 7.5 MG tablet    Sig: Take 1 tablet (7.5 mg total) by mouth daily.    Dispense:  30 tablet    Refill:  5     Meds ordered this encounter  Medications   meloxicam (MOBIC) 7.5 MG tablet    Sig: Take 1 tablet (7.5 mg total) by mouth daily.    Dispense:  30 tablet    Refill:  5

## 2021-11-21 ENCOUNTER — Other Ambulatory Visit: Payer: Self-pay

## 2021-11-21 ENCOUNTER — Ambulatory Visit (INDEPENDENT_AMBULATORY_CARE_PROVIDER_SITE_OTHER): Payer: Medicare Other | Admitting: Urology

## 2021-11-21 VITALS — BP 145/94 | HR 120

## 2021-11-21 DIAGNOSIS — R1032 Left lower quadrant pain: Secondary | ICD-10-CM

## 2021-11-21 DIAGNOSIS — R3915 Urgency of urination: Secondary | ICD-10-CM

## 2021-11-21 DIAGNOSIS — I861 Scrotal varices: Secondary | ICD-10-CM

## 2021-11-21 DIAGNOSIS — G8929 Other chronic pain: Secondary | ICD-10-CM

## 2021-11-21 DIAGNOSIS — R339 Retention of urine, unspecified: Secondary | ICD-10-CM

## 2021-11-21 LAB — BLADDER SCAN AMB NON-IMAGING: Scan Result: 8

## 2021-11-21 MED ORDER — MIRABEGRON ER 25 MG PO TB24: 25.0000 mg | ORAL_TABLET | Freq: Every day | ORAL | 11 refills | Status: AC

## 2021-11-21 NOTE — Progress Notes (Signed)
post void residual=120  Urological Symptom Review  Patient is experiencing the following symptoms: Frequent urination Burning/pain with urination   Review of Systems  Gastrointestinal (upper)  : Negative for upper GI symptoms  Gastrointestinal (lower) : Constipation  Constitutional : Negative for symptoms  Skin: Negative for skin symptoms  Eyes: Negative for eye symptoms  Ear/Nose/Throat : Negative for Ear/Nose/Throat symptoms  Hematologic/Lymphatic: Negative for Hematologic/Lymphatic symptoms  Cardiovascular : Negative for cardiovascular symptoms  Respiratory : Negative for respiratory symptoms  Endocrine: Negative for endocrine symptoms  Musculoskeletal: Joint pain  Neurological: Negative for neurological symptoms  Psychologic: Anxiety

## 2021-11-21 NOTE — Progress Notes (Signed)
Subjective: 1. Incomplete emptying of bladder   2. Urgency of urination   3. Groin pain, chronic, left   4. Left varicocele      05/23/21: Mr. Mccastle is an 81 yo male who had a TURP at [facility] 10 years ago and was doing well until about 5 months ago when he began to have nocturia x 3 with urgency with rare UUI.  He has some frequency.  He has some intermittency and a sensation of incomplete emptying.  He was given myrbetriq and is using the 25mg  with success.  His IPSS is 6.  His nocturia is down to 1x.  He can have some urgency but no incontinence.  His PVR today is 169ml but he didn't feel like he needed to give a specimen.   .  He has no dysuria or hematuria.  He has some chronic left testicular and groin pain but it seems related to his left hip which is cracking with movement and has reduced mobility.  He was given flomax but didn't take that.   11/21/21: Mr. Seawood returns today in f/u.  He remains on Myrbetriq 25mg .  He has stopped the tamsulosin. He has nocturia x 2 and a sensation of incomplete emptying.  He has mild dysuria and an odor to his urine.  His UA is clear.  His IPSS is 5.  His PVR is down slightly at 19ml.    IPSS     Row Name 11/21/21 1300         International Prostate Symptom Score   How often have you had the sensation of not emptying your bladder? Less than half the time     How often have you had to urinate less than every two hours? Less than 1 in 5 times     How often have you found you stopped and started again several times when you urinated? Not at All     How often have you found it difficult to postpone urination? Not at All     How often have you had a weak urinary stream? Not at All     How often have you had to strain to start urination? Not at All     How many times did you typically get up at night to urinate? 2 Times     Total IPSS Score 5       Quality of Life due to urinary symptoms   If you were to spend the rest of your life with your urinary condition  just the way it is now how would you feel about that? Mixed               ROS:  ROS  No Known Allergies  Past Medical History:  Diagnosis Date   Anxiety    Back pain    Chest pain    Heart burn    Heart disease    Hypertension     Past Surgical History:  Procedure Laterality Date   HERNIA REPAIR     TRANSURETHRAL RESECTION OF PROSTATE      Social History   Socioeconomic History   Marital status: Married    Spouse name: Not on file   Number of children: Not on file   Years of education: Not on file   Highest education level: Not on file  Occupational History   Not on file  Tobacco Use   Smoking status: Every Day    Packs/day: 1.00    Types: Cigarettes  Smokeless tobacco: Never   Tobacco comments:    pt reprots pack every two weeks.  Vaping Use   Vaping Use: Never used  Substance and Sexual Activity   Alcohol use: Yes    Comment: socail   Drug use: No   Sexual activity: Not on file  Other Topics Concern   Not on file  Social History Narrative   Widowed   No regular exercise   Social Determinants of Health   Financial Resource Strain: Not on file  Food Insecurity: Not on file  Transportation Needs: Not on file  Physical Activity: Not on file  Stress: Not on file  Social Connections: Not on file  Intimate Partner Violence: Not on file    Family History  Problem Relation Age of Onset   Cancer Father     Anti-infectives: Anti-infectives (From admission, onward)    None       Current Outpatient Medications  Medication Sig Dispense Refill   aspirin EC 81 MG tablet Take 81 mg by mouth every morning.     Cholecalciferol (VITAMIN D PO) Take 2 tablets by mouth daily. 10,000IUs daily     diphenhydrAMINE (BENADRYL) 25 MG tablet Take 1 tablet (25 mg total) by mouth every 6 (six) hours. 20 tablet 0   famotidine (PEPCID) 20 MG tablet Take 20 mg by mouth at bedtime.     HYDROcodone-acetaminophen (NORCO) 7.5-325 MG tablet Take 0.5 tablets by  mouth every 6 (six) hours as needed for moderate pain.      meloxicam (MOBIC) 7.5 MG tablet Take 1 tablet (7.5 mg total) by mouth daily. 30 tablet 5   Multiple Vitamins-Minerals (ICAPS PO) Take 1 capsule by mouth daily.     temazepam (RESTORIL) 30 MG capsule Take 30 mg by mouth at bedtime as needed for sleep.      triamterene-hydrochlorothiazide (MAXZIDE-25) 37.5-25 MG tablet Take 1 tablet by mouth daily. 90 tablet 3   VITAMIN E PO Take 1 tablet by mouth every morning.     mirabegron ER (MYRBETRIQ) 25 MG TB24 tablet Take 1 tablet (25 mg total) by mouth daily. start with the 25 mg and increase to 50 mg if needed 30 tablet 11   No current facility-administered medications for this visit.     Objective: Vital signs in last 24 hours: BP (!) 145/94    Pulse (!) 120   Intake/Output from previous day: No intake/output data recorded. Intake/Output this shift: @IOTHISSHIFT @   Physical Exam  Lab Results:  Results for orders placed or performed in visit on 11/21/21 (from the past 24 hour(s))  Urinalysis, Routine w reflex microscopic     Status: Abnormal   Collection Time: 11/21/21  1:13 PM  Result Value Ref Range   Specific Gravity, UA 1.025 1.005 - 1.030   pH, UA 5.0 5.0 - 7.5   Color, UA Yellow Yellow   Appearance Ur Clear Clear   Leukocytes,UA Negative Negative   Protein,UA Negative Negative/Trace   Glucose, UA Negative Negative   Ketones, UA Trace (A) Negative   RBC, UA Negative Negative   Bilirubin, UA Negative Negative   Urobilinogen, Ur 1.0 0.2 - 1.0 mg/dL   Nitrite, UA Negative Negative   Microscopic Examination Comment    Narrative   Performed at:  [facility] [address] [place], [place]  [id number] Lab Director: Calhoun, Phone:  [phone]    BMET No results for input(s): NA, K, CL, CO2, GLUCOSE, BUN, CREATININE, CALCIUM in the last  72 hours. PT/INR No results for input(s): LABPROT, INR in the last 72 hours. ABG No results for input(s):  PHART, HCO3 in the last 72 hours.  Invalid input(s): PCO2, PO2 UA is clear.  Studies/Results: No results found. PVR is 174ml.   Referral notes reviewed.  Prior labs in Epic reviewed.   Assessment/Plan: Frequency, Urgency and nocturia.   He is doing better on the myrbetriq 25mg  and will continue that med.   Incomplete emptying.  PVR is down to 142ml.   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   I will have him see Dr. Cain Sieve in [place] about that since that is his area of expertise.    Meds ordered this encounter  Medications   mirabegron ER (MYRBETRIQ) 25 MG TB24 tablet    Sig: Take 1 tablet (25 mg total) by mouth daily. start with the 25 mg and increase to 50 mg if needed    Dispense:  30 tablet    Refill:  11    28 25mg  and 28 50mg  samples given     Orders Placed This Encounter  Procedures   Urinalysis, Routine w reflex microscopic   Ambulatory referral to Urology    Referral Priority:   Routine    Referral Type:   Consultation    Referral Reason:   Specialty Services Required    Referred to Provider:   Vira Agar, MD    Requested Specialty:   Urology    Number of Visits Requested:   1   BLADDER SCAN AMB NON-IMAGING     Return in about 1 year (around 11/21/2022).    CC: Dr. Wende Neighbors.     Irine Seal 11/22/2021 [id number] ID: Lucia Estelle, male   DOB: [birth year], 81 y.o.   MRN: [id number] Patient ID: Dennis Ruiz, male   DOB: 21-Jul-1941, 81 y.o.   MRN: [id number]

## 2021-11-22 ENCOUNTER — Encounter: Payer: Self-pay | Admitting: Urology

## 2021-11-22 LAB — URINALYSIS, ROUTINE W REFLEX MICROSCOPIC
Bilirubin, UA: NEGATIVE
Glucose, UA: NEGATIVE
Leukocytes,UA: NEGATIVE
Nitrite, UA: NEGATIVE
Protein,UA: NEGATIVE
RBC, UA: NEGATIVE
Specific Gravity, UA: 1.025 (ref 1.005–1.030)
Urobilinogen, Ur: 1 mg/dL (ref 0.2–1.0)
pH, UA: 5 (ref 5.0–7.5)

## 2022-11-14 ENCOUNTER — Other Ambulatory Visit (HOSPITAL_COMMUNITY): Payer: Self-pay | Admitting: Interventional Cardiology

## 2022-11-14 ENCOUNTER — Encounter (HOSPITAL_COMMUNITY): Payer: Self-pay | Admitting: Internal Medicine

## 2022-11-14 ENCOUNTER — Ambulatory Visit (HOSPITAL_COMMUNITY)
Admission: RE | Admit: 2022-11-14 | Discharge: 2022-11-14 | Disposition: A | Discharge status: Home / Self Care | Payer: Medicare Other | Source: Ambulatory Visit | Attending: Interventional Cardiology | Admitting: Interventional Cardiology

## 2022-11-14 DIAGNOSIS — M25552 Pain in left hip: Secondary | ICD-10-CM

## 2022-11-14 DIAGNOSIS — M545 Low back pain, unspecified: Secondary | ICD-10-CM | POA: Diagnosis present

## 2022-11-27 ENCOUNTER — Ambulatory Visit: Payer: Medicare Other | Admitting: Urology

## 2023-02-05 ENCOUNTER — Encounter (HOSPITAL_COMMUNITY): Payer: Self-pay

## 2023-02-05 ENCOUNTER — Other Ambulatory Visit: Payer: Self-pay

## 2023-02-05 ENCOUNTER — Emergency Department (HOSPITAL_COMMUNITY)
Admission: EM | Admit: 2023-02-05 | Discharge: 2023-02-05 | Disposition: A | Discharge status: Home / Self Care | Payer: Medicare Other | Attending: Emergency Medicine | Admitting: Emergency Medicine

## 2023-02-05 ENCOUNTER — Emergency Department (HOSPITAL_COMMUNITY): Payer: Medicare Other

## 2023-02-05 DIAGNOSIS — M79602 Pain in left arm: Secondary | ICD-10-CM | POA: Diagnosis not present

## 2023-02-05 DIAGNOSIS — R200 Anesthesia of skin: Secondary | ICD-10-CM | POA: Diagnosis present

## 2023-02-05 DIAGNOSIS — Z7982 Long term (current) use of aspirin: Secondary | ICD-10-CM | POA: Diagnosis not present

## 2023-02-05 DIAGNOSIS — D696 Thrombocytopenia, unspecified: Secondary | ICD-10-CM | POA: Diagnosis not present

## 2023-02-05 DIAGNOSIS — N289 Disorder of kidney and ureter, unspecified: Secondary | ICD-10-CM | POA: Diagnosis not present

## 2023-02-05 LAB — BASIC METABOLIC PANEL
Anion gap: 7 (ref 5–15)
BUN: 26 mg/dL — ABNORMAL HIGH (ref 8–23)
CO2: 29 mmol/L (ref 22–32)
Calcium: 9.2 mg/dL (ref 8.9–10.3)
Chloride: 98 mmol/L (ref 98–111)
Creatinine, Ser: 1.34 mg/dL — ABNORMAL HIGH (ref 0.61–1.24)
GFR, Estimated: 53 mL/min — ABNORMAL LOW (ref >60)
Glucose, Bld: 96 mg/dL (ref 70–99)
Potassium: 5 mmol/L (ref 3.5–5.1)
Sodium: 134 mmol/L — ABNORMAL LOW (ref 135–145)

## 2023-02-05 LAB — CBC WITH DIFFERENTIAL/PLATELET
Abs Immature Granulocytes: 0.01 10*3/uL (ref 0.00–0.07)
Basophils Absolute: 0 10*3/uL (ref 0.0–0.1)
Basophils Relative: 1 %
Eosinophils Absolute: 0.2 10*3/uL (ref 0.0–0.5)
Eosinophils Relative: 5 %
HCT: 44 % (ref 39.0–52.0)
Hemoglobin: 14.6 g/dL (ref 13.0–17.0)
Immature Granulocytes: 0 %
Lymphocytes Relative: 47 %
Lymphs Abs: 2 10*3/uL (ref 0.7–4.0)
MCH: 31.9 pg (ref 26.0–34.0)
MCHC: 33.2 g/dL (ref 30.0–36.0)
MCV: 96.1 fL (ref 80.0–100.0)
Monocytes Absolute: 0.5 10*3/uL (ref 0.1–1.0)
Monocytes Relative: 11 %
Neutro Abs: 1.5 10*3/uL — ABNORMAL LOW (ref 1.7–7.7)
Neutrophils Relative %: 36 %
Platelets: 148 10*3/uL — ABNORMAL LOW (ref 150–400)
RBC: 4.58 MIL/uL (ref 4.22–5.81)
RDW: 13.1 % (ref 11.5–15.5)
WBC: 4.2 10*3/uL (ref 4.0–10.5)
nRBC: 0 % (ref 0.0–0.2)

## 2023-02-05 LAB — TROPONIN I (HIGH SENSITIVITY)
Troponin I (High Sensitivity): 3 ng/L (ref <18)
Troponin I (High Sensitivity): 3 ng/L (ref <18)

## 2023-02-05 NOTE — ED Triage Notes (Signed)
Patient from home for bilateral hand numbness that started 1 month ago. Numbness has now traveled to the L shoulder and is now experiencing pain the L arm. Upon arrival to ER, patient is alert and oriented, ambu

## 2023-02-05 NOTE — Discharge Instructions (Addendum)
Contact a health care provider if: You have chills or a fever. Your symptoms change or get worse. You develop sores on your feet, back, elbows, tailbone, or hips. You have trouble urinating or pain when urinating. You need more support at home. You have swelling, redness, or pain in your legs. Get help right away if: Your symptoms suddenly get worse. You have shortness of breath. You have pain or a dull ache above the level of your SCI. You have chest pain or trouble breathing. You do not feel safe at home.
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
## 2023-04-15 ENCOUNTER — Ambulatory Visit (HOSPITAL_COMMUNITY): Payer: Medicare Other | Attending: Family Medicine | Admitting: Physical Therapy

## 2023-04-15 ENCOUNTER — Other Ambulatory Visit: Payer: Self-pay

## 2023-04-15 DIAGNOSIS — M25552 Pain in left hip: Secondary | ICD-10-CM | POA: Diagnosis present

## 2023-04-15 DIAGNOSIS — M6281 Muscle weakness (generalized): Secondary | ICD-10-CM | POA: Insufficient documentation

## 2023-04-15 NOTE — Therapy (Signed)
OUTPATIENT PHYSICAL THERAPY LOWER EXTREMITY EVALUATION   Patient Name: Dennis Ruiz MRN: [id number] DOB:16-Apr-1941, 82 y.o., male Today's Date: 04/15/2023  END OF SESSION:  PT End of Session - 04/15/23 1309     Visit Number 1    Number of Visits 12    Date for PT Re-Evaluation 05/27/23    Authorization Type medicare    PT Start Time 1300    PT Stop Time 1340    PT Time Calculation (min) 40 min    Activity Tolerance Patient tolerated treatment well    Behavior During Therapy WFL for tasks assessed/performed             Past Medical History:  Diagnosis Date   Anxiety    Back pain    Chest pain    Heart burn    Heart disease    Hypertension    Past Surgical History:  Procedure Laterality Date   HERNIA REPAIR     TRANSURETHRAL RESECTION OF PROSTATE     Patient Active Problem List   Diagnosis Date Noted   Left varicocele 04/10/2015   ED (erectile dysfunction) 07/27/2012   History of BPH 07/27/2012   Incomplete emptying of bladder 07/27/2012   Increased frequency of urination 07/27/2012   Facial cellulitis 03/01/2012   Benign hypertension 03/01/2012   BACK PAIN 04/17/2009   CHEST PAIN 04/17/2009    PCP: Lupita Raider, NP  REFERRING PROVIDER: Lupita Raider, NP  REFERRING DIAG: M25.559 (ICD-10-CM) - Pain in unspecified hip  THERAPY DIAG:  Lt hip pain   Rationale for Evaluation and Treatment: Rehabilitation  ONSET DATE: chronic   SUBJECTIVE:   SUBJECTIVE STATEMENT: PT states that he has pain in both hips, however, his LT is the worst.  PT states that he has the most problem with his Lt hip.  He has trouble putting shoes and socks on his left leg, He goes up and down steps one at a time.  He normally will use a cane if he has to go any distance.  Pt will have increased pain after sitting:  for an hour, standing:  20 minutes; walking :  15 minutes  PERTINENT HISTORY: Back pain PAIN:  Are you having pain? Yes: Pain location: 8 Pain description: Lt  pain goes into his groin on occasion  Aggravating factors: activity Relieving factors: rest  PRECAUTIONS: None  WEIGHT BEARING RESTRICTIONS: No  FALLS:  Has patient fallen in last 6 months? No  LIVING ENVIRONMENT: Lives with: lives with their family Lives in: House/apartment Stairs: No Has following equipment at home: Single point cane  OCCUPATION: retired   PLOF: Independent with basic ADLs  PATIENT GOALS: less pain, more movement.   NEXT MD VISIT: 05/04/23  OBJECTIVE:   DIAGNOSTIC FINDINGS: IMPRESSION: 1. Severe osteoarthritis of the left hip. 2. Mild-moderate osteoarthritis of the right hip.      PATIENT SURVEYS:  FOTO 41  COGNITION: Overall cognitive status: Within functional limits for tasks assessed   POSTURE: decreased lumbar lordosis and decreased thoracic kyphosis    LOWER EXTREMITY ROM:  Active ROM Right eval Left eval  Hip flexion    Hip extension    Hip abduction    Hip adduction    Hip internal rotation 10 10  Hip external rotation 30 30  Knee flexion    Knee extension Lacking 5 Lacking 12  Ankle dorsiflexion    Ankle plantarflexion    Ankle inversion    Ankle eversion     (Blank rows =  not tested)  LOWER EXTREMITY MMT:  MMT Right eval Left eval  Hip flexion    Hip extension 5 5  Hip abduction 5 3+  Hip adduction    Hip internal rotation    Hip external rotation    Knee flexion 5 4  Knee extension 5 5  Ankle dorsiflexion 5 5  Ankle plantarflexion    Ankle inversion    Ankle eversion     (Blank rows = not tested) FUNCTIONAL TESTS:  30 seconds chair stand test:  unable without UE assist;  with UE assist pt is able to complete 7 reps (6 is poor for age and sex) 2 minute walk test: no assistive device x 339 ft  Single leg stance: Lt 15', rT 10"   TODAY'S TREATMENT:                                                                                                                              DATE:  04/15/23:  Eval  B quad set  x 10 B knee to chest x 3 for 30" each Lt hip abduction x 10 side lying   PATIENT EDUCATION:  Education details: HEP Person educated: Patient Education method: Programmer, multimedia, Verbal cues, and Handouts Education comprehension: returned demonstration  HOME EXERCISE PROGRAM: Access Code: [email or web address] URL: [email or web address] Date: 04/15/2023 Prepared by: Virgina Organ  Exercises - Quad Set  - 2 x daily - 7 x weekly - 1 sets - 10 reps - 5" hold - Sidelying Pelvic Floor Contraction with Hip Abduction  - 2 x daily - 7 x weekly - 1 sets - 10 reps - 5" hold - Supine Single Knee to Chest Stretch  - 2 x daily - 7 x weekly - 1 sets - 3 reps - 30-60" hold  ASSESSMENT:  CLINICAL IMPRESSION: Patient is a 82 y.o. male who was seen today for physical therapy evaluation and treatment for Rt hip pain.  Evaluation demonstrates increased pain, decreased activity tolerance, decreased ROM, decreased balance and decreased strength.  Mr. Oren will benefit from skilled PT to address his deficits and improve his functioning ability.   OBJECTIVE IMPAIRMENTS: decreased activity tolerance, decreased balance, difficulty walking, decreased ROM, decreased strength, and pain.   ACTIVITY LIMITATIONS: carrying, lifting, sitting, standing, squatting, stairs, and locomotion level  PARTICIPATION LIMITATIONS: cleaning, shopping, community activity, and yard work  PERSONAL FACTORS: Age and 1 comorbidity: severe OA  are also affecting patient's functional outcome.   REHAB POTENTIAL: Good  CLINICAL DECISION MAKING: Stable/uncomplicated  EVALUATION COMPLEXITY: Low   GOALS: Goals reviewed with patient? No  SHORT TERM GOALS: Target date: 05/06/23 Pt to be I in HEP in order to decrease his maximum pain to no greater than a 6/10 Baseline: Goal status: INITIAL  2.  Pt to be able to WALK 350 FT IN 2 MINUTE PERIOD Baseline:  Goal status: INITIAL  3.  Pt to be able to come sit to stand 12x in 30 seconds  to demonstrate improved functional strength.  Baseline:  Goal status: INITIAL    LONG TERM GOALS: Target date: 05/27/23  Pt to be I  in and advanced HEP to decrease his maximum pain to no greater than a 3/10 Baseline:  Goal status: INITIAL  2.  Pt to be able to balance on his LE for 20 seconds to reduce risk of falls  Baseline:  Goal status: INITIAL  3.  Pt hip and back ROM to improve to allow pt to get his shoe and sock on his lt foot.  Baseline:  Goal status: INITIAL  4.  PT to be able to be WB for 30 minutes without having to sit down to be able to complete tasks in and around his home Baseline:  Goal status: INITIAL     PLAN:  PT FREQUENCY: 2x/week  PT DURATION: 6 weeks  PLANNED INTERVENTIONS: Therapeutic exercises, Therapeutic activity, Neuromuscular re-education, Balance training, Gait training, Patient/Family education, Self Care, and Manual therapy  PLAN FOR NEXT SESSION: PT treatment to focus primarily on stretching of the hip MM to include piriformis as well as Thomas stretch as well as strengthening and balance.   Virgina Organ, PT CLT [phone]  04/15/2023, 5:04 PM

## 2023-04-21 ENCOUNTER — Ambulatory Visit (HOSPITAL_COMMUNITY): Payer: Medicare Other | Attending: Family Medicine | Admitting: Physical Therapy

## 2023-04-21 DIAGNOSIS — M6281 Muscle weakness (generalized): Secondary | ICD-10-CM | POA: Insufficient documentation

## 2023-04-21 DIAGNOSIS — M25552 Pain in left hip: Secondary | ICD-10-CM | POA: Diagnosis present

## 2023-04-21 NOTE — Therapy (Signed)
OUTPATIENT PHYSICAL THERAPY TREATMENT   Patient Name: Dennis Ruiz MRN: [id number] DOB:02/04/1941, 82 y.o., male Today's Date: 04/21/2023  END OF SESSION:  PT End of Session - 04/21/23 1350     Visit Number 2    Number of Visits 12    Date for PT Re-Evaluation 05/27/23    Authorization Type medicare    PT Start Time 1350    PT Stop Time 1430    PT Time Calculation (min) 40 min    Activity Tolerance Patient tolerated treatment well    Behavior During Therapy WFL for tasks assessed/performed             Past Medical History:  Diagnosis Date   Anxiety    Back pain    Chest pain    Heart burn    Heart disease    Hypertension    Past Surgical History:  Procedure Laterality Date   HERNIA REPAIR     TRANSURETHRAL RESECTION OF PROSTATE     Patient Active Problem List   Diagnosis Date Noted   Left varicocele 04/10/2015   ED (erectile dysfunction) 07/27/2012   History of BPH 07/27/2012   Incomplete emptying of bladder 07/27/2012   Increased frequency of urination 07/27/2012   Facial cellulitis 03/01/2012   Benign hypertension 03/01/2012   BACK PAIN 04/17/2009   CHEST PAIN 04/17/2009    PCP: Lupita Raider, NP  REFERRING PROVIDER: Lupita Raider, NP  REFERRING DIAG: M25.559 (ICD-10-CM) - Pain in unspecified hip  THERAPY DIAG:  Lt hip pain   Rationale for Evaluation and Treatment: Rehabilitation  ONSET DATE: chronic   SUBJECTIVE:   SUBJECTIVE STATEMENT: PT states he is doing well today. Doing the exercises.  Reports he has 8/10 pain in Lt hip today.   Evaluation:  PT states that he has pain in both hips, however, his LT is the worst.  PT states that he has the most problem with his Lt hip.  He has trouble putting shoes and socks on his left leg, He goes up and down steps one at a time.  He normally will use a cane if he has to go any distance.  Pt will have increased pain after sitting:  for an hour, standing:  20 minutes; walking :  15 minutes  PERTINENT  HISTORY: Back pain PAIN:  Are you having pain? Yes: Pain location: 8 Pain description: Lt pain goes into his groin on occasion  Aggravating factors: activity Relieving factors: rest  PRECAUTIONS: None  WEIGHT BEARING RESTRICTIONS: No  FALLS:  Has patient fallen in last 6 months? No  LIVING ENVIRONMENT: Lives with: lives with their family Lives in: House/apartment Stairs: No Has following equipment at home: Single point cane  OCCUPATION: retired   PLOF: Independent with basic ADLs  PATIENT GOALS: less pain, more movement.   NEXT MD VISIT: 05/04/23  OBJECTIVE:   DIAGNOSTIC FINDINGS: IMPRESSION: 1. Severe osteoarthritis of the left hip. 2. Mild-moderate osteoarthritis of the right hip.      PATIENT SURVEYS:  FOTO 41  COGNITION: Overall cognitive status: Within functional limits for tasks assessed   POSTURE: decreased lumbar lordosis and decreased thoracic kyphosis    LOWER EXTREMITY ROM:  Active ROM Right eval Left eval  Hip flexion    Hip extension    Hip abduction    Hip adduction    Hip internal rotation 10 10  Hip external rotation 30 30  Knee flexion    Knee extension Lacking 5 Lacking 12  Ankle dorsiflexion  Ankle plantarflexion    Ankle inversion    Ankle eversion     (Blank rows = not tested)  LOWER EXTREMITY MMT:  MMT Right eval Left eval  Hip flexion    Hip extension 5 5  Hip abduction 5 3+  Hip adduction    Hip internal rotation    Hip external rotation    Knee flexion 5 4  Knee extension 5 5  Ankle dorsiflexion 5 5  Ankle plantarflexion    Ankle inversion    Ankle eversion     (Blank rows = not tested) FUNCTIONAL TESTS:  30 seconds chair stand test:  unable without UE assist;  with UE assist pt is able to complete 7 reps (6 is poor for age and sex) 2 minute walk test: no assistive device x 339 ft  Single leg stance: Lt 15', rT 10"   TODAY'S TREATMENT:                                                                                                                               DATE:  04/21/23 Sitting:  Sit to stands no UE standard chair 5X (uses momentum/rocking)  Long arc quads 10X5" holds  Piriformis stretch Rt 3X30", Lt 2X30"(a lot tighter) Supine:  Bridge 2X10  SLR 2X10  Single knee to chest stretch 2X30" each Bilateral hip abduction 10X each  04/15/23:  Eval  B quad set x 10 B knee to chest x 3 for 30" each Lt hip abduction x 10 side lying   PATIENT EDUCATION:  Education details: HEP Person educated: Patient Education method: Programmer, multimedia, Verbal cues, and Handouts Education comprehension: returned demonstration  HOME EXERCISE PROGRAM: Access Code: [email or web address] URL: [email or web address]  Date: 04/15/2023 Prepared by: Virgina Organ Exercises - Quad Set  - 2 x daily - 7 x weekly - 1 sets - 10 reps - 5" hold - Sidelying Pelvic Floor Contraction with Hip Abduction  - 2 x daily - 7 x weekly - 1 sets - 10 reps - 5" hold - Supine Single Knee to Chest Stretch  - 2 x daily - 7 x weekly - 1 sets - 3 reps - 30-60" hold Access Code: [email or web address]  Date: 04/21/2023 Prepared by: Emeline Gins Exercises -- Seated Long Arc Quad  - 2 x daily - 7 x weekly - 1 sets - 10 reps - 5 sec hold - Sit to Stand Without Arm Support  - 2 x daily - 7 x weekly - 2 sets - 5 reps - Seated Figure 4 Piriformis Stretch  - 2 x daily - 7 x weekly - 1 sets - 3 reps - 30 sec hold  ASSESSMENT:  CLINICAL IMPRESSION: Reviewed goals and POC moving forward.  PT able to recall and complete given HEP correctly.  Instructed with seated glute/quad strengthening and added seated piriformis stretch to HEP.  Pt completed standing from standard chair without UE, however uses momentum to stand due to weakness.  Extreme tightness in bil piriformis, Lt much tighter than Rt with inability to lean forward due to restriction.  Supine glute and hip strengthening completed with cues for form and maintaining knee extension with straight leg  raises.  Pt reported no pain or issues during or at completion of session today. Pt will continue to benefit from skilled PT to address his deficits and improve his functioning ability.   OBJECTIVE IMPAIRMENTS: decreased activity tolerance, decreased balance, difficulty walking, decreased ROM, decreased strength, and pain.   ACTIVITY LIMITATIONS: carrying, lifting, sitting, standing, squatting, stairs, and locomotion level  PARTICIPATION LIMITATIONS: cleaning, shopping, community activity, and yard work  PERSONAL FACTORS: Age and 1 comorbidity: severe OA  are also affecting patient's functional outcome.   REHAB POTENTIAL: Good  CLINICAL DECISION MAKING: Stable/uncomplicated  EVALUATION COMPLEXITY: Low   GOALS: Goals reviewed with patient? Yes  SHORT TERM GOALS: Target date: 05/06/23 Pt to be I in HEP in order to decrease his maximum pain to no greater than a 6/10 Baseline: Goal status: IN PROGRESS  2.  Pt to be able to WALK 350 FT IN 2 MINUTE PERIOD Baseline:  Goal status: IN PROGRESS  3.  Pt to be able to come sit to stand 12x in 30 seconds to demonstrate improved functional strength.  Baseline:  Goal status: IN PROGRESS    LONG TERM GOALS: Target date: 05/27/23  Pt to be I  in and advanced HEP to decrease his maximum pain to no greater than a 3/10 Baseline:  Goal status: IN PROGRESS  2.  Pt to be able to balance on his LE for 20 seconds to reduce risk of falls  Baseline:  Goal status: IN PROGRESS  3.  Pt hip and back ROM to improve to allow pt to get his shoe and sock on his lt foot.  Baseline:  Goal status: IN PROGRESS  4.  PT to be able to be WB for 30 minutes without having to sit down to be able to complete tasks in and around his home Baseline:  Goal status: IN PROGRESS     PLAN:  PT FREQUENCY: 2x/week  PT DURATION: 6 weeks  PLANNED INTERVENTIONS: Therapeutic exercises, Therapeutic activity, Neuromuscular re-education, Balance training, Gait training,  Patient/Family education, Self Care, and Manual therapy  PLAN FOR NEXT SESSION: PT treatment to focus primarily on stretching of the hip MM.  Next session add thomas stretch and piriformis in supine.  Progress strength and balance as well.   Lurena Nida, PTA/CLT [facility] Ph: [phone]   04/21/2023, 2:29 PM

## 2023-04-28 ENCOUNTER — Ambulatory Visit (HOSPITAL_COMMUNITY): Payer: Medicare Other | Admitting: Physical Therapy

## 2023-04-28 DIAGNOSIS — M25552 Pain in left hip: Secondary | ICD-10-CM

## 2023-04-28 DIAGNOSIS — M6281 Muscle weakness (generalized): Secondary | ICD-10-CM

## 2023-04-28 NOTE — Therapy (Signed)
OUTPATIENT PHYSICAL THERAPY TREATMENT   Patient Name: Dennis Ruiz MRN: [id number] DOB:[birth year], 82 y.o., male Today's Date: 04/28/2023  END OF SESSION:  PT End of Session - 04/28/23 1351     Visit Number 3    Number of Visits 12    Date for PT Re-Evaluation 05/27/23    Authorization Type medicare    PT Start Time 1348    PT Stop Time 1430    PT Time Calculation (min) 42 min    Activity Tolerance Patient tolerated treatment well    Behavior During Therapy WFL for tasks assessed/performed              Past Medical History:  Diagnosis Date   Anxiety    Back pain    Chest pain    Heart burn    Heart disease    Hypertension    Past Surgical History:  Procedure Laterality Date   HERNIA REPAIR     TRANSURETHRAL RESECTION OF PROSTATE     Patient Active Problem List   Diagnosis Date Noted   Left varicocele 04/10/2015   ED (erectile dysfunction) 07/27/2012   History of BPH 07/27/2012   Incomplete emptying of bladder 07/27/2012   Increased frequency of urination 07/27/2012   Facial cellulitis 03/01/2012   Benign hypertension 03/01/2012   BACK PAIN 04/17/2009   CHEST PAIN 04/17/2009    PCP: Lupita Raider, NP  REFERRING PROVIDER: Lupita Raider, NP  REFERRING DIAG: M25.559 (ICD-10-CM) - Pain in unspecified hip  THERAPY DIAG:  Lt hip pain   Rationale for Evaluation and Treatment: Rehabilitation  ONSET DATE: chronic   SUBJECTIVE:   SUBJECTIVE STATEMENT: PT states he "feels 60-40".  States he done some of his exercises as he was busy over the weekend with visitors/cookouts. Reports currently he has 6/10 pain in Lt hip today.   Evaluation:  PT states that he has pain in both hips, however, his LT is the worst.  PT states that he has the most problem with his Lt hip.  He has trouble putting shoes and socks on his left leg, He goes up and down steps one at a time.  He normally will use a cane if he has to go any distance.  Pt will have increased pain after  sitting:  for an hour, standing:  20 minutes; walking :  15 minutes  PERTINENT HISTORY: Back pain PAIN:  Are you having pain? Yes: Pain location: 6 Pain description: Lt pain goes into his groin on occasion  Aggravating factors: activity Relieving factors: rest  PRECAUTIONS: None  WEIGHT BEARING RESTRICTIONS: No  FALLS:  Has patient fallen in last 6 months? No  LIVING ENVIRONMENT: Lives with: lives with their family Lives in: House/apartment Stairs: No Has following equipment at home: Single point cane  OCCUPATION: retired   PLOF: Independent with basic ADLs  PATIENT GOALS: less pain, more movement.   NEXT MD VISIT: 05/04/23  OBJECTIVE:   DIAGNOSTIC FINDINGS: IMPRESSION: 1. Severe osteoarthritis of the left hip. 2. Mild-moderate osteoarthritis of the right hip.      PATIENT SURVEYS:  FOTO 41  COGNITION: Overall cognitive status: Within functional limits for tasks assessed   POSTURE: decreased lumbar lordosis and decreased thoracic kyphosis    LOWER EXTREMITY ROM:  Active ROM Right eval Left eval  Hip flexion    Hip extension    Hip abduction    Hip adduction    Hip internal rotation 10 10  Hip external rotation 30 30  Knee  flexion    Knee extension Lacking 5 Lacking 12  Ankle dorsiflexion    Ankle plantarflexion    Ankle inversion    Ankle eversion     (Blank rows = not tested)  LOWER EXTREMITY MMT:  MMT Right eval Left eval  Hip flexion    Hip extension 5 5  Hip abduction 5 3+  Hip adduction    Hip internal rotation    Hip external rotation    Knee flexion 5 4  Knee extension 5 5  Ankle dorsiflexion 5 5  Ankle plantarflexion    Ankle inversion    Ankle eversion     (Blank rows = not tested) FUNCTIONAL TESTS:  30 seconds chair stand test:  unable without UE assist;  with UE assist pt is able to complete 7 reps (6 is poor for age and sex) 2 minute walk test: no assistive device x 339 ft  Single leg stance: Lt 15', rT  10"   TODAY'S TREATMENT:                                                                                                                              DATE:  04/28/23 Sitting: sit to stands no UE standard chair 10X2  Piriformis stretch 1 minute each LE  Long sitting hamstring stretch 2X30" each LE Supine: Bridge 2X10  SLR 2X10  Piriformis stretch 30"x2 each LE  04/21/23 Sitting:  Sit to stands no UE standard chair 5X (uses momentum/rocking)  Long arc quads 10X5" holds  Piriformis stretch Rt 3X30", Lt 2X30"(a lot tighter) Supine:  Bridge 2X10  SLR 2X10  Single knee to chest stretch 2X30" each Bilateral hip abduction 10X each  04/15/23:  Eval  B quad set x 10 B knee to chest x 3 for 30" each Lt hip abduction x 10 side lying   PATIENT EDUCATION:  Education details: HEP Person educated: Patient Education method: Programmer, multimedia, Verbal cues, and Handouts Education comprehension: returned demonstration  HOME EXERCISE PROGRAM: Access Code: [email or web address] URL: [email or web address]  Date: 04/15/2023 Prepared by: Virgina Organ Exercises - Quad Set  - 2 x daily - 7 x weekly - 1 sets - 10 reps - 5" hold - Sidelying Pelvic Floor Contraction with Hip Abduction  - 2 x daily - 7 x weekly - 1 sets - 10 reps - 5" hold - Supine Single Knee to Chest Stretch  - 2 x daily - 7 x weekly - 1 sets - 3 reps - 30-60" hold Access Code: [email or web address]  Date: 04/21/2023 Prepared by: Emeline Gins Exercises -- Seated Long Arc Quad  - 2 x daily - 7 x weekly - 1 sets - 10 reps - 5 sec hold - Sit to Stand Without Arm Support  - 2 x daily - 7 x weekly - 2 sets - 5 reps - Seated Figure 4 Piriformis Stretch  - 2 x daily - 7 x weekly - 1 sets - 3 reps - 30  sec hold  ASSESSMENT:  CLINICAL IMPRESSION: Began session with sit to stands with ability to complete without rocking/momentum this session.   Increased to 2 sets to improve quad/glut strength.  Piriformis stretch completed in seated with increased ease  with placement and hold of LE's.  Lt notably tighter.  Began  long seated hamstring stretch  Supine glute and hip strengthening completed with cues for form and maintaining knee extension with straight leg raises.  Pt reported no pain or issues during or at completion of session today. Pt will continue to benefit from skilled PT to address his deficits and improve his functioning ability.   OBJECTIVE IMPAIRMENTS: decreased activity tolerance, decreased balance, difficulty walking, decreased ROM, decreased strength, and pain.   ACTIVITY LIMITATIONS: carrying, lifting, sitting, standing, squatting, stairs, and locomotion level  PARTICIPATION LIMITATIONS: cleaning, shopping, community activity, and yard work  PERSONAL FACTORS: Age and 1 comorbidity: severe OA  are also affecting patient's functional outcome.   REHAB POTENTIAL: Good  CLINICAL DECISION MAKING: Stable/uncomplicated  EVALUATION COMPLEXITY: Low   GOALS: Goals reviewed with patient? Yes  SHORT TERM GOALS: Target date: 05/06/23 Pt to be I in HEP in order to decrease his maximum pain to no greater than a 6/10 Baseline: Goal status: IN PROGRESS  2.  Pt to be able to WALK 350 FT IN 2 MINUTE PERIOD Baseline:  Goal status: IN PROGRESS  3.  Pt to be able to come sit to stand 12x in 30 seconds to demonstrate improved functional strength.  Baseline:  Goal status: IN PROGRESS    LONG TERM GOALS: Target date: 05/27/23  Pt to be I  in and advanced HEP to decrease his maximum pain to no greater than a 3/10 Baseline:  Goal status: IN PROGRESS  2.  Pt to be able to balance on his LE for 20 seconds to reduce risk of falls  Baseline:  Goal status: IN PROGRESS  3.  Pt hip and back ROM to improve to allow pt to get his shoe and sock on his lt foot.  Baseline:  Goal status: IN PROGRESS  4.  PT to be able to be WB for 30 minutes without having to sit down to be able to complete tasks in and around his home Baseline:  Goal status:  IN PROGRESS     PLAN:  PT FREQUENCY: 2x/week  PT DURATION: 6 weeks  PLANNED INTERVENTIONS: Therapeutic exercises, Therapeutic activity, Neuromuscular re-education, Balance training, Gait training, Patient/Family education, Self Care, and Manual therapy  PLAN FOR NEXT SESSION: PT treatment to focus primarily on stretching of the hip MM.  Next session add thomas stretch and provide update to HEP to include long sitting hamstring stretch. Progress strength and balance as well.   Lurena Nida, PTA/CLT [facility] Ph: [phone]   04/28/2023, 1:51 PM

## 2023-04-30 ENCOUNTER — Encounter (HOSPITAL_COMMUNITY): Payer: Medicare Other | Admitting: Physical Therapy

## 2023-05-12 ENCOUNTER — Ambulatory Visit (HOSPITAL_COMMUNITY): Payer: Medicare Other | Admitting: Physical Therapy

## 2023-05-12 DIAGNOSIS — M25552 Pain in left hip: Secondary | ICD-10-CM

## 2023-05-12 DIAGNOSIS — M6281 Muscle weakness (generalized): Secondary | ICD-10-CM

## 2023-05-12 NOTE — Therapy (Signed)
OUTPATIENT PHYSICAL THERAPY TREATMENT   Patient Name: Dennis Ruiz MRN: [id number] DOB:1941-04-07, 82 y.o., male Today's Date: 05/12/2023  END OF SESSION:  PT End of Session - 05/12/23 1342     Visit Number 4    Number of Visits 12    Date for PT Re-Evaluation 05/27/23    Authorization Type medicare    PT Start Time 1345    PT Stop Time 1429    PT Time Calculation (min) 44 min    Activity Tolerance Patient tolerated treatment well    Behavior During Therapy WFL for tasks assessed/performed               Past Medical History:  Diagnosis Date   Anxiety    Back pain    Chest pain    Heart burn    Heart disease    Hypertension    Past Surgical History:  Procedure Laterality Date   HERNIA REPAIR     TRANSURETHRAL RESECTION OF PROSTATE     Patient Active Problem List   Diagnosis Date Noted   Left varicocele 04/10/2015   ED (erectile dysfunction) 07/27/2012   History of BPH 07/27/2012   Incomplete emptying of bladder 07/27/2012   Increased frequency of urination 07/27/2012   Facial cellulitis 03/01/2012   Benign hypertension 03/01/2012   BACK PAIN 04/17/2009   CHEST PAIN 04/17/2009    PCP: Lupita Raider, NP  REFERRING PROVIDER: Lupita Raider, NP  REFERRING DIAG: M25.559 (ICD-10-CM) - Pain in unspecified hip  THERAPY DIAG:  Lt hip pain   Rationale for Evaluation and Treatment: Rehabilitation  ONSET DATE: chronic   SUBJECTIVE:   SUBJECTIVE STATEMENT: PT states he has not been here because we didn't have any openings.  States he's feeling better 5/10 pain in Lt hip.  States he's been completing his HEP but still has to use his hands to get up out of a chair.    Evaluation:  PT states that he has pain in both hips, however, his LT is the worst.  PT states that he has the most problem with his Lt hip.  He has trouble putting shoes and socks on his left leg, He goes up and down steps one at a time.  He normally will use a cane if he has to go any  distance.  Pt will have increased pain after sitting:  for an hour, standing:  20 minutes; walking :  15 minutes  PERTINENT HISTORY: Back pain PAIN:  Are you having pain? Yes: Pain location: 5 Pain description: Lt pain goes into his groin on occasion  Aggravating factors: activity Relieving factors: rest  PRECAUTIONS: None  WEIGHT BEARING RESTRICTIONS: No  FALLS:  Has patient fallen in last 6 months? No  LIVING ENVIRONMENT: Lives with: lives with their family Lives in: House/apartment Stairs: No Has following equipment at home: Single point cane  OCCUPATION: retired   PLOF: Independent with basic ADLs  PATIENT GOALS: less pain, more movement.   NEXT MD VISIT: 05/04/23  OBJECTIVE:   DIAGNOSTIC FINDINGS: IMPRESSION: 1. Severe osteoarthritis of the left hip. 2. Mild-moderate osteoarthritis of the right hip.      PATIENT SURVEYS:  FOTO 41  COGNITION: Overall cognitive status: Within functional limits for tasks assessed   POSTURE: decreased lumbar lordosis and decreased thoracic kyphosis    LOWER EXTREMITY ROM:  Active ROM Right eval Left eval  Hip flexion    Hip extension    Hip abduction    Hip adduction  Hip internal rotation 10 10  Hip external rotation 30 30  Knee flexion    Knee extension Lacking 5 Lacking 12  Ankle dorsiflexion    Ankle plantarflexion    Ankle inversion    Ankle eversion     (Blank rows = not tested)  LOWER EXTREMITY MMT:  MMT Right eval Left eval  Hip flexion    Hip extension 5 5  Hip abduction 5 3+  Hip adduction    Hip internal rotation    Hip external rotation    Knee flexion 5 4  Knee extension 5 5  Ankle dorsiflexion 5 5  Ankle plantarflexion    Ankle inversion    Ankle eversion     (Blank rows = not tested) FUNCTIONAL TESTS:  30 seconds chair stand test:  unable without UE assist;  with UE assist pt is able to complete 7 reps (6 is poor for age and sex) 2 minute walk test: no assistive device x 339 ft   Single leg stance: Lt 15', rT 10"   TODAY'S TREATMENT:                                                                                                                              DATE:  05/12/23 Sitting: sit to stands no UE standard chair 10X2  Piriformis stretch 1 minute each LE  Long sitting hamstring stretch 2X30" each LE Supine: Bridge 2X10  SLR 2X10  Piriformis stretch 30"x2 each LE  Thomas stretch 1 minute each side X 2 Nustep 5 minutes LE only level 4 ([place] run) seat   04/28/23 Sitting: sit to stands no UE standard chair 10X2  Piriformis stretch 1 minute each LE  Long sitting hamstring stretch 2X30" each LE Supine: Bridge 2X10  SLR 2X10  Piriformis stretch 30"x2 each LE  04/21/23 Sitting:  Sit to stands no UE standard chair 5X (uses momentum/rocking)  Long arc quads 10X5" holds  Piriformis stretch Rt 3X30", Lt 2X30"(a lot tighter) Supine:  Bridge 2X10  SLR 2X10  Single knee to chest stretch 2X30" each Bilateral hip abduction 10X each  04/15/23:  Eval  B quad set x 10 B knee to chest x 3 for 30" each Lt hip abduction x 10 side lying   PATIENT EDUCATION:  Education details: HEP Person educated: Patient Education method: Programmer, multimedia, Verbal cues, and Handouts Education comprehension: returned demonstration  HOME EXERCISE PROGRAM: Access Code: [email or web address] URL: [email or web address]  Date: 04/15/2023 Prepared by: Virgina Organ Exercises - Quad Set  - 2 x daily - 7 x weekly - 1 sets - 10 reps - 5" hold - Sidelying Pelvic Floor Contraction with Hip Abduction  - 2 x daily - 7 x weekly - 1 sets - 10 reps - 5" hold - Supine Single Knee to Chest Stretch  - 2 x daily - 7 x weekly - 1 sets - 3 reps - 30-60" hold Access Code: [email or web address]  Date: 04/21/2023 Prepared by:  Kayelynn Abdou Bascom Levels Exercises -- Seated Long Arc Quad  - 2 x daily - 7 x weekly - 1 sets - 10 reps - 5 sec hold - Sit to Stand Without Arm Support  - 2 x daily - 7 x weekly - 2 sets - 5  reps - Seated Figure 4 Piriformis Stretch  - 2 x daily - 7 x weekly - 1 sets - 3 reps - 30 sec hold  Date: 05/12/2023 Exercises - Seated Table Hamstring Stretch  - 2 x daily - 7 x weekly - 1 sets - 3 reps - 30 sec hold - Modified Thomas Stretch  - 2 x daily - 7 x weekly - 1 sets - 2 reps - 1 minute hold  ASSESSMENT:  CLINICAL IMPRESSION: Continued with LE strengthening and stretching.  Thomas stretch added this session in supine with visible tightness Lt>>RT.  Completed 2 sets of supine exercises this session without pain, only weakness with SLR.  Ended today with nustep to work on Radiographer, therapeutic. Updated HEP to include thomas and hamstring stretches.   Pt reported no pain or issues during or at completion of session today. Pt will continue to benefit from skilled PT to address his deficits and improve his functioning ability.   OBJECTIVE IMPAIRMENTS: decreased activity tolerance, decreased balance, difficulty walking, decreased ROM, decreased strength, and pain.   ACTIVITY LIMITATIONS: carrying, lifting, sitting, standing, squatting, stairs, and locomotion level  PARTICIPATION LIMITATIONS: cleaning, shopping, community activity, and yard work  PERSONAL FACTORS: Age and 1 comorbidity: severe OA  are also affecting patient's functional outcome.   REHAB POTENTIAL: Good  CLINICAL DECISION MAKING: Stable/uncomplicated  EVALUATION COMPLEXITY: Low   GOALS: Goals reviewed with patient? Yes  SHORT TERM GOALS: Target date: 05/06/23 Pt to be I in HEP in order to decrease his maximum pain to no greater than a 6/10 Baseline: Goal status: IN PROGRESS  2.  Pt to be able to WALK 350 FT IN 2 MINUTE PERIOD Baseline:  Goal status: IN PROGRESS  3.  Pt to be able to come sit to stand 12x in 30 seconds to demonstrate improved functional strength.  Baseline:  Goal status: IN PROGRESS    LONG TERM GOALS: Target date: 05/27/23  Pt to be I  in and advanced HEP to decrease his maximum  pain to no greater than a 3/10 Baseline:  Goal status: IN PROGRESS  2.  Pt to be able to balance on his LE for 20 seconds to reduce risk of falls  Baseline:  Goal status: IN PROGRESS  3.  Pt hip and back ROM to improve to allow pt to get his shoe and sock on his lt foot.  Baseline:  Goal status: IN PROGRESS  4.  PT to be able to be WB for 30 minutes without having to sit down to be able to complete tasks in and around his home Baseline:  Goal status: IN PROGRESS     PLAN:  PT FREQUENCY: 2x/week  PT DURATION: 6 weeks  PLANNED INTERVENTIONS: Therapeutic exercises, Therapeutic activity, Neuromuscular re-education, Balance training, Gait training, Patient/Family education, Self Care, and Manual therapy  PLAN FOR NEXT SESSION: PT treatment to focus primarily on stretching of the hip MM.  Progress strength and balance as well.   Lurena Nida, PTA/CLT [facility] Ph: [phone]   05/12/2023, 2:30 PM

## 2023-05-14 ENCOUNTER — Ambulatory Visit (HOSPITAL_COMMUNITY): Payer: Medicare Other | Admitting: Physical Therapy

## 2023-05-14 DIAGNOSIS — M6281 Muscle weakness (generalized): Secondary | ICD-10-CM

## 2023-05-14 DIAGNOSIS — M25552 Pain in left hip: Secondary | ICD-10-CM | POA: Diagnosis not present

## 2023-05-14 NOTE — Therapy (Signed)
OUTPATIENT PHYSICAL THERAPY TREATMENT   Patient Name: Dennis Ruiz MRN: [id number] DOB:[birth year], 82 y.o., male Today's Date: 05/14/2023  END OF SESSION:  PT End of Session - 05/14/23 1346     Visit Number 5    Number of Visits 12    Date for PT Re-Evaluation 05/27/23    Authorization Type medicare    PT Start Time 1346    PT Stop Time 1425    PT Time Calculation (min) 39 min    Activity Tolerance Patient tolerated treatment well    Behavior During Therapy WFL for tasks assessed/performed               Past Medical History:  Diagnosis Date   Anxiety    Back pain    Chest pain    Heart burn    Heart disease    Hypertension    Past Surgical History:  Procedure Laterality Date   HERNIA REPAIR     TRANSURETHRAL RESECTION OF PROSTATE     Patient Active Problem List   Diagnosis Date Noted   Left varicocele 04/10/2015   ED (erectile dysfunction) 07/27/2012   History of BPH 07/27/2012   Incomplete emptying of bladder 07/27/2012   Increased frequency of urination 07/27/2012   Facial cellulitis 03/01/2012   Benign hypertension 03/01/2012   BACK PAIN 04/17/2009   CHEST PAIN 04/17/2009    PCP: Lupita Raider, NP  REFERRING PROVIDER: Lupita Raider, NP  REFERRING DIAG: M25.559 (ICD-10-CM) - Pain in unspecified hip  THERAPY DIAG:  Lt hip pain   Rationale for Evaluation and Treatment: Rehabilitation  ONSET DATE: chronic   SUBJECTIVE:   SUBJECTIVE STATEMENT: PT states he has not been here because we didn't have any openings.  States he's feeling better 5/10 pain in Lt hip.  States he's been completing his HEP but still has to use his hands to get up out of a chair.    Evaluation:  PT states that he has pain in both hips, however, his LT is the worst.  PT states that he has the most problem with his Lt hip.  He has trouble putting shoes and socks on his left leg, He goes up and down steps one at a time.  He normally will use a cane if he has to go any  distance.  Pt will have increased pain after sitting:  for an hour, standing:  20 minutes; walking :  15 minutes  PERTINENT HISTORY: Back pain PAIN:  Are you having pain? Yes: Pain location: 5 Pain description: Lt pain goes into his groin on occasion  Aggravating factors: activity Relieving factors: rest  PRECAUTIONS: None  WEIGHT BEARING RESTRICTIONS: No  FALLS:  Has patient fallen in last 6 months? No  LIVING ENVIRONMENT: Lives with: lives with their family Lives in: House/apartment Stairs: No Has following equipment at home: Single point cane  OCCUPATION: retired   PLOF: Independent with basic ADLs  PATIENT GOALS: less pain, more movement.   NEXT MD VISIT: 05/04/23  OBJECTIVE:   DIAGNOSTIC FINDINGS: IMPRESSION: 1. Severe osteoarthritis of the left hip. 2. Mild-moderate osteoarthritis of the right hip.      PATIENT SURVEYS:  FOTO 41  COGNITION: Overall cognitive status: Within functional limits for tasks assessed   POSTURE: decreased lumbar lordosis and decreased thoracic kyphosis    LOWER EXTREMITY ROM:  Active ROM Right eval Left eval  Hip flexion    Hip extension    Hip abduction    Hip adduction  Hip internal rotation 10 10  Hip external rotation 30 30  Knee flexion    Knee extension Lacking 5 Lacking 12  Ankle dorsiflexion    Ankle plantarflexion    Ankle inversion    Ankle eversion     (Blank rows = not tested)  LOWER EXTREMITY MMT:  MMT Right eval Left eval  Hip flexion    Hip extension 5 5  Hip abduction 5 3+  Hip adduction    Hip internal rotation    Hip external rotation    Knee flexion 5 4  Knee extension 5 5  Ankle dorsiflexion 5 5  Ankle plantarflexion    Ankle inversion    Ankle eversion     (Blank rows = not tested) FUNCTIONAL TESTS:  30 seconds chair stand test:  unable without UE assist;  with UE assist pt is able to complete 7 reps (6 is poor for age and sex) 2 minute walk test: no assistive device x 339 ft   Single leg stance: Lt 15', rT 10"   TODAY'S TREATMENT:                                                                                                                              DATE:  05/14/23 Sitting: sit to stands no UE standard chair 10X2  Piriformis stretch 1 minute each LE  Long sitting hamstring stretch 2X30" each LE Supine: Bridge 2X10  SLR 2X10  Piriformis stretch 30"x2 each LE  Thomas stretch 1 minute each side X 2  Lower trunk rotations 10X Nustep 5 minutes LE only level 4 ([place] run) seat   05/12/23 Sitting: sit to stands no UE standard chair 10X2  Piriformis stretch 1 minute each LE  Long sitting hamstring stretch 2X30" each LE Supine: Bridge 2X10  SLR 2X10  Piriformis stretch 30"x2 each LE  Thomas stretch 1 minute each side X 2 Nustep 5 minutes LE only level 4 ([place] run) seat   04/28/23 Sitting: sit to stands no UE standard chair 10X2  Piriformis stretch 1 minute each LE  Long sitting hamstring stretch 2X30" each LE Supine: Bridge 2X10  SLR 2X10  Piriformis stretch 30"x2 each LE  04/21/23 Sitting:  Sit to stands no UE standard chair 5X (uses momentum/rocking)  Long arc quads 10X5" holds  Piriformis stretch Rt 3X30", Lt 2X30"(a lot tighter) Supine:  Bridge 2X10  SLR 2X10  Single knee to chest stretch 2X30" each Bilateral hip abduction 10X each  04/15/23:  Eval  B quad set x 10 B knee to chest x 3 for 30" each Lt hip abduction x 10 side lying   PATIENT EDUCATION:  Education details: HEP Person educated: Patient Education method: Programmer, multimedia, Verbal cues, and Handouts Education comprehension: returned demonstration  HOME EXERCISE PROGRAM: Access Code: [email or web address] URL: [email or web address]  Date: 04/15/2023 Prepared by: Virgina Organ Exercises - Quad Set  - 2 x daily - 7 x weekly - 1 sets -  10 reps - 5" hold - Sidelying Pelvic Floor Contraction with Hip Abduction  - 2 x daily - 7 x weekly - 1 sets - 10 reps - 5"  hold - Supine Single Knee to Chest Stretch  - 2 x daily - 7 x weekly - 1 sets - 3 reps - 30-60" hold Access Code: [email or web address]  Date: 04/21/2023 Prepared by: Emeline Gins Exercises -- Seated Long Arc Quad  - 2 x daily - 7 x weekly - 1 sets - 10 reps - 5 sec hold - Sit to Stand Without Arm Support  - 2 x daily - 7 x weekly - 2 sets - 5 reps - Seated Figure 4 Piriformis Stretch  - 2 x daily - 7 x weekly - 1 sets - 3 reps - 30 sec hold  Date: 05/12/2023 Exercises - Seated Table Hamstring Stretch  - 2 x daily - 7 x weekly - 1 sets - 3 reps - 30 sec hold - Modified Thomas Stretch  - 2 x daily - 7 x weekly - 1 sets - 2 reps - 1 minute hold  ASSESSMENT:  CLINICAL IMPRESSION: Continued with LE strengthening and stretching.   Pt overall improving with less cues needed to complete therex correctly.  Less tightness noted with thomas stretch this session.    Pt reported no pain or issues during or at completion of session today. Pt will continue to benefit from skilled PT to address his deficits and improve his functioning ability.   OBJECTIVE IMPAIRMENTS: decreased activity tolerance, decreased balance, difficulty walking, decreased ROM, decreased strength, and pain.   ACTIVITY LIMITATIONS: carrying, lifting, sitting, standing, squatting, stairs, and locomotion level  PARTICIPATION LIMITATIONS: cleaning, shopping, community activity, and yard work  PERSONAL FACTORS: Age and 1 comorbidity: severe OA  are also affecting patient's functional outcome.   REHAB POTENTIAL: Good  CLINICAL DECISION MAKING: Stable/uncomplicated  EVALUATION COMPLEXITY: Low   GOALS: Goals reviewed with patient? Yes  SHORT TERM GOALS: Target date: 05/06/23 Pt to be I in HEP in order to decrease his maximum pain to no greater than a 6/10 Baseline: Goal status: IN PROGRESS  2.  Pt to be able to WALK 350 FT IN 2 MINUTE PERIOD Baseline:  Goal status: IN PROGRESS  3.  Pt to be able to come sit to stand 12x in 30 seconds  to demonstrate improved functional strength.  Baseline:  Goal status: IN PROGRESS    LONG TERM GOALS: Target date: 05/27/23  Pt to be I  in and advanced HEP to decrease his maximum pain to no greater than a 3/10 Baseline:  Goal status: IN PROGRESS  2.  Pt to be able to balance on his LE for 20 seconds to reduce risk of falls  Baseline:  Goal status: IN PROGRESS  3.  Pt hip and back ROM to improve to allow pt to get his shoe and sock on his lt foot.  Baseline:  Goal status: IN PROGRESS  4.  PT to be able to be WB for 30 minutes without having to sit down to be able to complete tasks in and around his home Baseline:  Goal status: IN PROGRESS     PLAN:  PT FREQUENCY: 2x/week  PT DURATION: 6 weeks  PLANNED INTERVENTIONS: Therapeutic exercises, Therapeutic activity, Neuromuscular re-education, Balance training, Gait training, Patient/Family education, Self Care, and Manual therapy  PLAN FOR NEXT SESSION: PT treatment to focus primarily on stretching of the hip MM.  Progress strength and balance as well.  Lurena Nida, PTA/CLT [facility] Ph: [phone]   05/14/2023, 1:46 PM

## 2023-05-18 ENCOUNTER — Telehealth (HOSPITAL_COMMUNITY): Payer: Self-pay | Admitting: Physical Therapy

## 2023-05-18 ENCOUNTER — Ambulatory Visit (HOSPITAL_COMMUNITY): Payer: Medicare Other | Admitting: Physical Therapy

## 2023-05-18 NOTE — Telephone Encounter (Signed)
Pt did not show for appt. Unable to reach by phone or leave VM.  Lurena Nida, PTA/CLT [facility] Ph: [phone]

## 2023-05-20 ENCOUNTER — Ambulatory Visit (HOSPITAL_COMMUNITY): Payer: Medicare Other | Admitting: Physical Therapy

## 2023-05-20 DIAGNOSIS — M25552 Pain in left hip: Secondary | ICD-10-CM | POA: Diagnosis not present

## 2023-05-20 DIAGNOSIS — M6281 Muscle weakness (generalized): Secondary | ICD-10-CM

## 2023-05-20 NOTE — Therapy (Signed)
OUTPATIENT PHYSICAL THERAPY TREATMENT   Patient Name: Dennis Ruiz MRN: [id number] DOB:1941/07/01, 82 y.o., male Today's Date: 05/20/2023  END OF SESSION:  PT End of Session - 05/20/23 1356     Visit Number 6    Number of Visits 12    Date for PT Re-Evaluation 05/27/23    Authorization Type medicare    PT Start Time 1350    PT Stop Time 1430    PT Time Calculation (min) 40 min    Activity Tolerance Patient tolerated treatment well    Behavior During Therapy WFL for tasks assessed/performed               Past Medical History:  Diagnosis Date   Anxiety    Back pain    Chest pain    Heart burn    Heart disease    Hypertension    Past Surgical History:  Procedure Laterality Date   HERNIA REPAIR     TRANSURETHRAL RESECTION OF PROSTATE     Patient Active Problem List   Diagnosis Date Noted   Left varicocele 04/10/2015   ED (erectile dysfunction) 07/27/2012   History of BPH 07/27/2012   Incomplete emptying of bladder 07/27/2012   Increased frequency of urination 07/27/2012   Facial cellulitis 03/01/2012   Benign hypertension 03/01/2012   BACK PAIN 04/17/2009   CHEST PAIN 04/17/2009    PCP: Lupita Raider, NP  REFERRING PROVIDER: Lupita Raider, NP  REFERRING DIAG: M25.559 (ICD-10-CM) - Pain in unspecified hip  THERAPY DIAG:  Lt hip pain   Rationale for Evaluation and Treatment: Rehabilitation  ONSET DATE: chronic   SUBJECTIVE:   SUBJECTIVE STATEMENT: PT states he had to go to a funeral and that is why he did not make his last appt.  Pt reports he did not have the clinic number but he has it now.  States he got a ball and has been using it at home.  Reports he is much better 5/10 pain in Lt hip.  States he's been completing his HEP but still has to use his hands to get up out of a chair.    Evaluation:  PT states that he has pain in both hips, however, his LT is the worst.  PT states that he has the most problem with his Lt hip.  He has trouble  putting shoes and socks on his left leg, He goes up and down steps one at a time.  He normally will use a cane if he has to go any distance.  Pt will have increased pain after sitting:  for an hour, standing:  20 minutes; walking :  15 minutes  PERTINENT HISTORY: Back pain PAIN:  Are you having pain? Yes: Pain location: 5 Pain description: Lt pain goes into his groin on occasion  Aggravating factors: activity Relieving factors: rest  PRECAUTIONS: None  WEIGHT BEARING RESTRICTIONS: No  FALLS:  Has patient fallen in last 6 months? No  LIVING ENVIRONMENT: Lives with: lives with their family Lives in: House/apartment Stairs: No Has following equipment at home: Single point cane  OCCUPATION: retired   PLOF: Independent with basic ADLs  PATIENT GOALS: less pain, more movement.   NEXT MD VISIT: 05/04/23  OBJECTIVE:   DIAGNOSTIC FINDINGS: IMPRESSION: 1. Severe osteoarthritis of the left hip. 2. Mild-moderate osteoarthritis of the right hip.      PATIENT SURVEYS:  FOTO 41  COGNITION: Overall cognitive status: Within functional limits for tasks assessed   POSTURE: decreased lumbar lordosis and  decreased thoracic kyphosis    LOWER EXTREMITY ROM:  Active ROM Right eval Left eval  Hip flexion    Hip extension    Hip abduction    Hip adduction    Hip internal rotation 10 10  Hip external rotation 30 30  Knee flexion    Knee extension Lacking 5 Lacking 12  Ankle dorsiflexion    Ankle plantarflexion    Ankle inversion    Ankle eversion     (Blank rows = not tested)  LOWER EXTREMITY MMT:  MMT Right eval Left eval  Hip flexion    Hip extension 5 5  Hip abduction 5 3+  Hip adduction    Hip internal rotation    Hip external rotation    Knee flexion 5 4  Knee extension 5 5  Ankle dorsiflexion 5 5  Ankle plantarflexion    Ankle inversion    Ankle eversion     (Blank rows = not tested) FUNCTIONAL TESTS:  30 seconds chair stand test:  unable without UE  assist;  with UE assist pt is able to complete 7 reps (6 is poor for age and sex) 2 minute walk test: no assistive device x 339 ft  Single leg stance: Lt 15', rT 10"   TODAY'S TREATMENT:                                                                                                                              DATE:  05/20/23 Standing:  hip excursions 10X each direction  Hip abduction 2X10  Hip extension 2X10  Sitting: sit to stands no UE standard chair 10X2  Long sitting hamstring stretch 2X30" each LE Supine: Bridge 2X10  SLR 2X10  Thomas stretch 1 minute each side X 2  Lower trunk rotations 10X Nustep 5 minutes UE/LE only level 5 ([place] run) seat 12  05/14/23 Sitting: sit to stands no UE standard chair 10X2  Piriformis stretch 1 minute each LE  Long sitting hamstring stretch 2X30" each LE Supine: Bridge 2X10  SLR 2X10  Piriformis stretch 30"x2 each LE  Thomas stretch 1 minute each side X 2  Lower trunk rotations 10X Nustep 5 minutes LE only level 4 ([place] run) seat   05/12/23 Sitting: sit to stands no UE standard chair 10X2  Piriformis stretch 1 minute each LE  Long sitting hamstring stretch 2X30" each LE Supine: Bridge 2X10  SLR 2X10  Piriformis stretch 30"x2 each LE  Thomas stretch 1 minute each side X 2 Nustep 5 minutes LE only level 4 ([place] run) seat   04/28/23 Sitting: sit to stands no UE standard chair 10X2  Piriformis stretch 1 minute each LE  Long sitting hamstring stretch 2X30" each LE Supine: Bridge 2X10  SLR 2X10  Piriformis stretch 30"x2 each LE  04/21/23 Sitting:  Sit to stands no UE standard chair 5X (uses momentum/rocking)  Long arc quads 10X5" holds  Piriformis stretch Rt 3X30", Lt  2X30"(a lot tighter) Supine:  Bridge 2X10  SLR 2X10  Single knee to chest stretch 2X30" each Bilateral hip abduction 10X each  04/15/23:  Eval  B quad set x 10 B knee to chest x 3 for 30" each Lt hip abduction x 10 side lying   PATIENT  EDUCATION:  Education details: HEP Person educated: Patient Education method: Programmer, multimedia, Verbal cues, and Handouts Education comprehension: returned demonstration  HOME EXERCISE PROGRAM: Access Code: [email or web address] URL: [email or web address]  Date: 04/15/2023 Prepared by: Virgina Organ Exercises - Quad Set  - 2 x daily - 7 x weekly - 1 sets - 10 reps - 5" hold - Sidelying Pelvic Floor Contraction with Hip Abduction  - 2 x daily - 7 x weekly - 1 sets - 10 reps - 5" hold - Supine Single Knee to Chest Stretch  - 2 x daily - 7 x weekly - 1 sets - 3 reps - 30-60" hold Access Code: [email or web address]  Date: 04/21/2023 Prepared by: Emeline Gins Exercises -- Seated Long Arc Quad  - 2 x daily - 7 x weekly - 1 sets - 10 reps - 5 sec hold - Sit to Stand Without Arm Support  - 2 x daily - 7 x weekly - 2 sets - 5 reps - Seated Figure 4 Piriformis Stretch  - 2 x daily - 7 x weekly - 1 sets - 3 reps - 30 sec hold  Date: 05/12/2023 Exercises - Seated Table Hamstring Stretch  - 2 x daily - 7 x weekly - 1 sets - 3 reps - 30 sec hold - Modified Thomas Stretch  - 2 x daily - 7 x weekly - 1 sets - 2 reps - 1 minute hold  ASSESSMENT:  CLINICAL IMPRESSION: Continued with LE strengthening and stretching.  Added hip excursions to improve lumbar mobility.  PT required cues to complete static stretches as tends to bounce into end ROM.  Stanidng hip strengthening added with max postural cues.  Displays poor forward and laterally bent posturing during exercises.  Pt overall improving with less tightness and dysfunction noted. No pain or issues during or at completion of session today. Pt will continue to benefit from skilled PT to address his deficits and improve his functioning ability.   OBJECTIVE IMPAIRMENTS: decreased activity tolerance, decreased balance, difficulty walking, decreased ROM, decreased strength, and pain.   ACTIVITY LIMITATIONS: carrying, lifting, sitting, standing, squatting, stairs, and  locomotion level  PARTICIPATION LIMITATIONS: cleaning, shopping, community activity, and yard work  PERSONAL FACTORS: Age and 1 comorbidity: severe OA  are also affecting patient's functional outcome.   REHAB POTENTIAL: Good  CLINICAL DECISION MAKING: Stable/uncomplicated  EVALUATION COMPLEXITY: Low   GOALS: Goals reviewed with patient? Yes  SHORT TERM GOALS: Target date: 05/06/23 Pt to be I in HEP in order to decrease his maximum pain to no greater than a 6/10 Baseline: Goal status: IN PROGRESS  2.  Pt to be able to WALK 350 FT IN 2 MINUTE PERIOD Baseline:  Goal status: IN PROGRESS  3.  Pt to be able to come sit to stand 12x in 30 seconds to demonstrate improved functional strength.  Baseline:  Goal status: IN PROGRESS    LONG TERM GOALS: Target date: 05/27/23  Pt to be I  in and advanced HEP to decrease his maximum pain to no greater than a 3/10 Baseline:  Goal status: IN PROGRESS  2.  Pt to be able to balance on his LE for 20 seconds to reduce risk of falls  Baseline:  Goal status: IN PROGRESS  3.  Pt hip and back ROM to improve to allow pt to get his shoe and sock on his lt foot.  Baseline:  Goal status: IN PROGRESS  4.  PT to be able to be WB for 30 minutes without having to sit down to be able to complete tasks in and around his home Baseline:  Goal status: IN PROGRESS     PLAN:  PT FREQUENCY: 2x/week  PT DURATION: 6 weeks  PLANNED INTERVENTIONS: Therapeutic exercises, Therapeutic activity, Neuromuscular re-education, Balance training, Gait training, Patient/Family education, Self Care, and Manual therapy  PLAN FOR NEXT SESSION: PT treatment to focus primarily on stretching of the hip MM.  Progress strength and balance as well.update HEP as needed.   Lurena Nida, PTA/CLT [facility] Ph: [phone]   05/20/2023, 1:58 PM

## 2023-05-26 ENCOUNTER — Ambulatory Visit (HOSPITAL_COMMUNITY): Payer: Medicare Other | Attending: Family Medicine | Admitting: Physical Therapy

## 2023-05-26 DIAGNOSIS — M6281 Muscle weakness (generalized): Secondary | ICD-10-CM | POA: Diagnosis present

## 2023-05-26 DIAGNOSIS — M25552 Pain in left hip: Secondary | ICD-10-CM | POA: Insufficient documentation

## 2023-05-26 NOTE — Therapy (Signed)
OUTPATIENT PHYSICAL THERAPY TREATMENT   Patient Name: Dennis Ruiz MRN: [id number] DOB:01/08/1941, 82 y.o., male Today's Date: 05/26/2023  END OF SESSION:  PT End of Session - 05/26/23 1340    Visit Number 7    Number of Visits 12    Date for PT Re-Evaluation 06/27/23    Authorization Type medicare    Progress Note Due on Visit 10    PT Start Time 1300    PT Stop Time 1340    PT Time Calculation (min) 40 min    Activity Tolerance Patient tolerated treatment well    Behavior During Therapy WFL for tasks assessed/performed               Past Medical History:  Diagnosis Date   Anxiety    Back pain    Chest pain    Heart burn    Heart disease    Hypertension    Past Surgical History:  Procedure Laterality Date   HERNIA REPAIR     TRANSURETHRAL RESECTION OF PROSTATE     Patient Active Problem List   Diagnosis Date Noted   Left varicocele 04/10/2015   ED (erectile dysfunction) 07/27/2012   History of BPH 07/27/2012   Incomplete emptying of bladder 07/27/2012   Increased frequency of urination 07/27/2012   Facial cellulitis 03/01/2012   Benign hypertension 03/01/2012   BACK PAIN 04/17/2009   CHEST PAIN 04/17/2009    PCP: Lupita Raider, NP  REFERRING PROVIDER: Lupita Raider, NP  REFERRING DIAG: M25.559 (ICD-10-CM) - Pain in unspecified hip  THERAPY DIAG:  Lt hip pain   Rationale for Evaluation and Treatment: Rehabilitation  ONSET DATE: chronic   SUBJECTIVE:   SUBJECTIVE STATEMENT:Pt states that he is still having pain and weakness   Evaluation:  PT states that he has pain in both hips, however, his LT is the worst.  PT states that he has the most problem with his Lt hip.  He has trouble putting shoes and socks on his left leg, He goes up and down steps one at a time.  He normally will use a cane if he has to go any distance.  Pt will have increased pain after sitting:  for an hour, standing:  20 minutes; walking :  15 minutes  PERTINENT  HISTORY: Back pain PAIN:  Are you having pain? Yes: Pain location: 5 Pain description: Lt pain goes into his groin on occasion  Aggravating factors: activity Relieving factors: rest  PRECAUTIONS: None  WEIGHT BEARING RESTRICTIONS: No  FALLS:  Has patient fallen in last 6 months? No  LIVING ENVIRONMENT: Lives with: lives with their family Lives in: House/apartment Stairs: No Has following equipment at home: Single point cane  OCCUPATION: retired   PLOF: Independent with basic ADLs  PATIENT GOALS: less pain, more movement.   NEXT MD VISIT: 05/04/23  OBJECTIVE:   DIAGNOSTIC FINDINGS: IMPRESSION: 1. Severe osteoarthritis of the left hip. 2. Mild-moderate osteoarthritis of the right hip.      PATIENT SURVEYS:  04/15/23 FOTO 41 05/26/23:  47  COGNITION: Overall cognitive status: Within functional limits for tasks assessed   POSTURE: decreased lumbar lordosis and decreased thoracic kyphosis    LOWER EXTREMITY ROM:  Active ROM Right eval 05/26/23 Left eval 05/26/23 Left   Hip flexion    90  Hip extension      Hip abduction      Hip adduction      Hip internal rotation 10 20 10 20   Hip external rotation  30 35 30 30  Knee flexion      Knee extension Lacking 5 Lacking 5  Lacking 12 Lacking 8  Ankle dorsiflexion      Ankle plantarflexion      Ankle inversion      Ankle eversion       (Blank rows = not tested)  LOWER EXTREMITY MMT:  MMT Right eval Left eval Left 05/26/23  Hip flexion     Hip extension 5 5   Hip abduction 5 3+ 4  Hip adduction     Hip internal rotation     Hip external rotation     Knee flexion 5 4 5   Knee extension 5 5   Ankle dorsiflexion 5 5   Ankle plantarflexion     Ankle inversion     Ankle eversion      (Blank rows = not tested) FUNCTIONAL TESTS: Eval: 04/15/23:  30 seconds chair stand test:  unable without UE assist;  with UE assist pt is able to complete 7 reps (6 is poor for age and sex)          05/26/23:    9 with no UE assist.   Below average for age and sex  2 minute walk test: no assistive device x 339 ft            05/26/23: 370 ft  Single leg stance: Lt 15', rT 10"           05/26/23 : Lt:  40: , RT: 35"   TODAY'S TREATMENT:                                                                                                                              DATE:  05/26/23 Reassessment : see above  Sit to stand x 10  Supine hamstring stretch 3 x 30" B Piriformis stretch figure 4 3 x 30" 05/20/23 Standing:  hip excursions 10X each direction  Hip abduction 2X10  Hip extension 2X10  Sitting: sit to stands no UE standard chair 10X2  Long sitting hamstring stretch 2X30" each LE Supine: Bridge 2X10  SLR 2X10  Thomas stretch 1 minute each side X 2  Lower trunk rotations 10X Nustep 5 minutes UE/LE only level 5 ([place] run) seat 12 PATIENT EDUCATION:  Education details: HEP Person educated: Patient Education method: Programmer, multimedia, Verbal cues, and Handouts Education comprehension: returned demonstration  HOME EXERCISE PROGRAM: Access Code: [email or web address] URL: [email or web address]  Date: 04/15/2023 Prepared by: Virgina Organ Exercises - Quad Set  - 2 x daily - 7 x weekly - 1 sets - 10 reps - 5" hold - Sidelying Pelvic Floor Contraction with Hip Abduction  - 2 x daily - 7 x weekly - 1 sets - 10 reps - 5" hold - Supine Single Knee to Chest Stretch  - 2 x daily - 7 x weekly - 1 sets - 3 reps - 30-60" hold Access Code: [email or web address]  Date: 04/21/2023 Prepared  by: Emeline Gins Exercises -- Seated Long Arc Quad  - 2 x daily - 7 x weekly - 1 sets - 10 reps - 5 sec hold - Sit to Stand Without Arm Support  - 2 x daily - 7 x weekly - 2 sets - 5 reps - Seated Figure 4 Piriformis Stretch  - 2 x daily - 7 x weekly - 1 sets - 3 reps - 30 sec hold  Date: 05/12/2023 Exercises - Seated Table Hamstring Stretch  - 2 x daily - 7 x weekly - 1 sets - 3 reps - 30 sec hold - Modified Thomas Stretch  - 2 x daily - 7 x weekly  - 1 sets - 2 reps - 1 minute hold             05/26/23           Exercises - Supine Figure 4 Piriformis Stretch with Leg Extension  - 1 x daily - 7 x weekly - 1 sets - 3 reps - 60"  hold - Sidelying Pelvic Floor Contraction with Hip Abduction  - 2 x daily - 7 x weekly - 1 sets - 10 reps - 5" hold ASSESSMENT:  CLINICAL IMPRESSION: PT reassessed with noted improvement in all areas but continues to be very tight in his Lt hip.  Strength is functional at this time. Gt is improved.    Pt overall improving with less tightness and dysfunction noted. Pt main limitation at this time is his Lt hip ROM.   Pt will continue to benefit from skilled PT to address his deficits and improve his functioning ability.   OBJECTIVE IMPAIRMENTS: decreased activity tolerance, decreased balance, difficulty walking, decreased ROM, decreased strength, and pain.   ACTIVITY LIMITATIONS: carrying, lifting, sitting, standing, squatting, stairs, and locomotion level  PARTICIPATION LIMITATIONS: cleaning, shopping, community activity, and yard work  PERSONAL FACTORS: Age and 1 comorbidity: severe OA  are also affecting patient's functional outcome.   REHAB POTENTIAL: Good  CLINICAL DECISION MAKING: Stable/uncomplicated  EVALUATION COMPLEXITY: Low   GOALS: Goals reviewed with patient? Yes  SHORT TERM GOALS: Target date: 05/06/23 Pt to be I in HEP in order to decrease his maximum pain to no greater than a 6/10 Baseline: Goal status: met  2.  Pt to be able to WALK 350 FT IN 2 MINUTE PERIOD Baseline:  Goal status:met  3.  Pt to be able to come sit to stand 12x in 30 seconds to demonstrate improved functional strength.  Baseline:  Goal status: IN PROGRESS    LONG TERM GOALS: Target date: 05/27/23  Pt to be I  in and advanced HEP to decrease his maximum pain to no greater than a 3/10 Baseline:  Goal status: IN PROGRESS  2.  Pt to be able to balance on his LE for 20 seconds to reduce risk of falls  Baseline:   Goal status: met   3.  Pt hip and back ROM to improve to allow pt to get his shoe and sock on his lt foot.  Baseline:  Goal status: partially met   4.  PT to be able to be WB for 30 minutes without having to sit down to be able to complete tasks in and around his home Baseline:  Goal status: met      PLAN:  PT FREQUENCY: 2x/week  PT DURATION: 6 weeks  PLANNED INTERVENTIONS: Therapeutic exercises, Therapeutic activity, Neuromuscular re-education, Balance training, Gait training, Patient/Family education, Self Care, and Manual therapy  PLAN  FOR NEXT SESSION: Continue  to focus primarily on stretching of the hip MM.  Progress functional strength .   Virgina Organ, PT CLT [phone]   05/26/2023, 1:41 PM

## 2023-05-28 ENCOUNTER — Encounter (HOSPITAL_COMMUNITY): Payer: Medicare Other | Admitting: Physical Therapy

## 2023-06-02 ENCOUNTER — Encounter (HOSPITAL_COMMUNITY): Payer: Medicare Other | Admitting: Physical Therapy

## 2023-06-04 ENCOUNTER — Encounter (HOSPITAL_COMMUNITY): Payer: Medicare Other | Admitting: Physical Therapy

## 2023-06-10 ENCOUNTER — Encounter (HOSPITAL_COMMUNITY): Payer: Medicare Other | Admitting: Physical Therapy

## 2023-06-18 ENCOUNTER — Other Ambulatory Visit: Payer: Self-pay

## 2023-06-18 ENCOUNTER — Emergency Department (HOSPITAL_COMMUNITY)
Admission: EM | Admit: 2023-06-18 | Discharge: 2023-06-18 | Disposition: A | Discharge status: Home / Self Care | Payer: Medicare Other | Attending: Emergency Medicine | Admitting: Emergency Medicine

## 2023-06-18 ENCOUNTER — Encounter (HOSPITAL_COMMUNITY): Payer: Self-pay | Admitting: Emergency Medicine

## 2023-06-18 DIAGNOSIS — K5900 Constipation, unspecified: Secondary | ICD-10-CM

## 2023-06-18 DIAGNOSIS — J029 Acute pharyngitis, unspecified: Secondary | ICD-10-CM

## 2023-06-18 DIAGNOSIS — Z20822 Contact with and (suspected) exposure to covid-19: Secondary | ICD-10-CM | POA: Diagnosis not present

## 2023-06-18 DIAGNOSIS — R5381 Other malaise: Secondary | ICD-10-CM | POA: Diagnosis present

## 2023-06-18 LAB — SARS CORONAVIRUS 2 BY RT PCR: SARS Coronavirus 2 by RT PCR: NEGATIVE

## 2023-06-18 LAB — GROUP A STREP BY PCR: Group A Strep by PCR: NOT DETECTED

## 2023-06-18 MED ORDER — ACETAMINOPHEN 325 MG PO TABS
650.0000 mg | ORAL_TABLET | Freq: Once | ORAL | Status: AC
  Administered 2023-06-18: 650 mg via ORAL
  Filled 2023-06-18: qty 2

## 2023-06-18 MED ORDER — LIDOCAINE VISCOUS HCL 2 % MT SOLN
15.0000 mL | Freq: Once | OROMUCOSAL | Status: AC
  Administered 2023-06-18: 15 mL via OROMUCOSAL
  Filled 2023-06-18: qty 15

## 2023-06-18 MED ORDER — POLYETHYLENE GLYCOL 3350 17 G PO PACK: 17.0000 g | PACK | Freq: Every day | ORAL | 0 refills | Status: AC

## 2023-06-18 NOTE — ED Notes (Signed)
Pt walked from waiting room to FT 21 Pt complains of sore throat, green sputum (no cough) and body feeling drained x1 week  COVID and strep completed in triage are negative Pt denies cough and fever, ABD pain, n/v/d. Attached to partial monitor Call bell on stretcher Waiting for MD exam and further orders

## 2023-06-18 NOTE — Discharge Instructions (Addendum)
Evaluation today was overall reassuring.  Recommend you follow-up with your PCP for your constipation and ongoing sore throat.  In the meantime for constipation sent MiraLAX to your pharmacy also recommend increasing fiber intake and adequate hydration.  For sore throat you can take Tylenol and ibuprofen as needed.  If it persist would also follow-up with PCP could be reflux in which case you may need to be started on a PPI.

## 2023-06-18 NOTE — ED Provider Notes (Signed)
[place] EMERGENCY DEPARTMENT AT [facility] Provider Note   CSN: [id number] Arrival date & time: 06/18/23  1323     History  Chief Complaint  Patient presents with   Sore Throat   HPI Dennis Ruiz is a 82 y.o. male presenting for sore throat, general malaise and loss of appetite for 1 week.  Denies fever and chills.    States appetite has been poor but reports yesterday had a salad, chicken and dumplings, Ensure for breakfast along with serial and fruit.  States he has had issues with constipation.  Last bowel movement was yesterday.  Does take oxycodone intermittently for pain.  Not currently taking anything for his constipation.  Denies abdominal pain, nausea vomiting or diarrhea.  Denies blood in the stool.  States the back of his throat has been sore. Still eating and drinking.  Denies shortness of breath and chest pain.  Denies headache.   Sore Throat       Home Medications Prior to Admission medications   Medication Sig Start Date End Date Taking? Authorizing Provider  polyethylene glycol (MIRALAX / GLYCOLAX) 17 g packet Take 17 g by mouth daily. 06/18/23  Yes Gareth Eagle, PA-C  aspirin EC 81 MG tablet Take 81 mg by mouth every morning.    [provider]  Cholecalciferol (VITAMIN D PO) Take 2 tablets by mouth daily. 10,000IUs daily    [provider]  diphenhydrAMINE (BENADRYL) 25 MG tablet Take 1 tablet (25 mg total) by mouth every 6 (six) hours. 04/09/21   Burgess Amor, PA-C  famotidine (PEPCID) 20 MG tablet Take 20 mg by mouth at bedtime.    [provider]  HYDROcodone-acetaminophen (NORCO) 7.5-325 MG tablet Take 0.5 tablets by mouth every 6 (six) hours as needed for moderate pain.     [provider]  mirabegron ER (MYRBETRIQ) 25 MG TB24 tablet Take 1 tablet (25 mg total) by mouth daily. start with the 25 mg and increase to 50 mg if needed Patient not taking: Reported on 02/05/2023 11/21/21   Bjorn Pippin, MD  temazepam  (RESTORIL) 30 MG capsule Take 30 mg by mouth at bedtime as needed for sleep.  09/05/15   [provider]  triamterene-hydrochlorothiazide (MAXZIDE-25) 37.5-25 MG tablet Take 1 tablet by mouth daily. 01/07/21   Elenore Paddy, NP  VITAMIN E PO Take 1 tablet by mouth every morning.    [provider]      Allergies    Patient has no known allergies.    Review of Systems   See HPI for pertinent positives  Physical Exam Updated Vital Signs BP (!) 138/91 (BP Location: Right Arm)   Pulse 63   Temp 97.7 F (36.5 C) (Oral)   Resp 18   Ht 6\' 2"  (1.88 m)   Wt 84.8 kg   SpO2 94%   BMI 24.01 kg/m  Physical Exam Vitals and nursing note reviewed.  HENT:     Head: Normocephalic and atraumatic.     Mouth/Throat:     Mouth: Mucous membranes are moist.     Palate: No mass and lesions.     Pharynx: Oropharynx is clear. Uvula midline. No pharyngeal swelling, oropharyngeal exudate, posterior oropharyngeal erythema, uvula swelling or postnasal drip.     Tonsils: No tonsillar abscesses.  Eyes:     General:        Right eye: No discharge.        Left eye: No discharge.  Conjunctiva/sclera: Conjunctivae normal.  Cardiovascular:     Rate and Rhythm: Normal rate and regular rhythm.     Pulses: Normal pulses.     Heart sounds: Normal heart sounds.  Pulmonary:     Effort: Pulmonary effort is normal.     Breath sounds: Normal breath sounds.  Abdominal:     General: Abdomen is flat. There is no distension.     Palpations: Abdomen is soft.     Tenderness: There is no abdominal tenderness.  Skin:    General: Skin is warm and dry.  Neurological:     General: No focal deficit present.  Psychiatric:        Mood and Affect: Mood normal.     ED Results / Procedures / Treatments   Labs (all labs ordered are listed, but only abnormal results are displayed) Labs Reviewed  SARS CORONAVIRUS 2 BY RT PCR  GROUP A STREP BY PCR    EKG None  Radiology No results  found.  Procedures Procedures    Medications Ordered in ED Medications  acetaminophen (TYLENOL) tablet 650 mg (has no administration in time range)  lidocaine (XYLOCAINE) 2 % viscous mouth solution 15 mL (has no administration in time range)    ED Course/ Medical Decision Making/ A&P                                 Medical Decision Making  82 year old well-appearing male presenting for sore throat, general malaise and loss of appetite and constipation for a week.  Exam was unremarkable.  Patient did endorse poor appetite but after further discussion regarding his diet daily, appears that he may be consuming an adequate amount of calories based on what he reported.  Strep PCR and COVID PCR both negative.  Inspection of the posterior oropharynx does not suggest abscess.  Considered bowel obstruction but also but unlikely given patient stated he is still passing flatus and had a normal bowel movement yesterday.  Also considered intra-abdominal infection but likely given patient is afebrile, nontender and nondistended abdomen.  Overall patient looks healthy for his age.  He did report that he takes oxycodone for pain this could be contributing to his constipation.  For his constipation recommended fiber intake hydration and MiraLAX.  Advise follow-up with PCP for his constipation and ongoing sore throat.  Sore throat could be a viral process versus reflux.  Vitals stable.  Discussed return precautions.  Discharged home in condition.        Final Clinical Impression(s) / ED Diagnoses Final diagnoses:  Sore throat  Constipation, unspecified constipation type    Rx / DC Orders ED Discharge Orders          Ordered    polyethylene glycol (MIRALAX / GLYCOLAX) 17 g packet  Daily        06/18/23 1737              Gareth Eagle, PA-C 06/18/23 1739    Vanetta Mulders, MD 06/19/23 1556

## 2023-06-18 NOTE — ED Triage Notes (Signed)
Per pt he has had a sore throat, general malaise, and loss of appetite, x1 week.

## 2023-06-26 ENCOUNTER — Other Ambulatory Visit (HOSPITAL_COMMUNITY): Payer: Self-pay | Admitting: Family Medicine

## 2023-06-26 DIAGNOSIS — R634 Abnormal weight loss: Secondary | ICD-10-CM

## 2023-07-07 ENCOUNTER — Telehealth (HOSPITAL_COMMUNITY): Payer: Self-pay | Admitting: Physical Therapy

## 2023-07-07 ENCOUNTER — Encounter (HOSPITAL_COMMUNITY): Payer: Self-pay | Admitting: Physical Therapy

## 2023-07-07 NOTE — Telephone Encounter (Signed)
Called pt as he did not make any more appointments following last re assess.  Pt states that he decided to try something different as he becomes sore after therapy.  Virgina Organ, PT CLT [phone]

## 2023-07-07 NOTE — Therapy (Signed)
PHYSICAL THERAPY DISCHARGE SUMMARY  Visits from Start of Care: 7  Current functional level related to goals / functional outcomes: 2/3 STG met; 2/4 LTG MET   Remaining deficits: Very tight in the hips and back    Education / Equipment: HEP   Patient agrees to discharge. Patient goals were partially met. Patient is being discharged due to the patient's request.  Virgina Organ, PT CLT [phone]

## 2023-07-14 ENCOUNTER — Ambulatory Visit (INDEPENDENT_AMBULATORY_CARE_PROVIDER_SITE_OTHER): Payer: Medicare Other | Admitting: Podiatry

## 2023-07-14 ENCOUNTER — Ambulatory Visit: Payer: Medicare Other | Admitting: Podiatry

## 2023-07-14 ENCOUNTER — Encounter (HOSPITAL_BASED_OUTPATIENT_CLINIC_OR_DEPARTMENT_OTHER): Payer: Self-pay

## 2023-07-14 DIAGNOSIS — M79675 Pain in left toe(s): Secondary | ICD-10-CM

## 2023-07-14 DIAGNOSIS — B351 Tinea unguium: Secondary | ICD-10-CM | POA: Diagnosis not present

## 2023-07-14 DIAGNOSIS — R5383 Other fatigue: Secondary | ICD-10-CM

## 2023-07-14 DIAGNOSIS — M79674 Pain in right toe(s): Secondary | ICD-10-CM

## 2023-07-14 NOTE — Progress Notes (Signed)
Subjective:  Patient ID: Dennis Ruiz, male    DOB: 20-Sep-1941,  MRN: [id number]  Chief Complaint  Patient presents with   Nail Problem    Nail trim    82 y.o. male returns for the above complaint.  Patient presents with thickened and again dystrophic mycotic toenails x 10 mild pain on palpation worse with ambulation worse with pressure would like for me debride down is not able to do it himself.  Denies any other acute complaints  Objective:  There were no vitals filed for this visit. Podiatric Exam: Vascular: dorsalis pedis and posterior tibial pulses are palpable bilateral. Capillary return is immediate. Temperature gradient is WNL. Skin turgor WNL  Sensorium: Normal Semmes Weinstein monofilament test. Normal tactile sensation bilaterally. Nail Exam: Pt has thick disfigured discolored nails with subungual debris noted bilateral entire nail hallux through fifth toenails.  Pain on palpation to the nails. Ulcer Exam: There is no evidence of ulcer or pre-ulcerative changes or infection. Orthopedic Exam: Muscle tone and strength are WNL. No limitations in general ROM. No crepitus or effusions noted.  Skin: No Porokeratosis. No infection or ulcers    Assessment & Plan:   1. Pain due to onychomycosis of toenails of both feet     Patient was evaluated and treated and all questions answered.  Onychomycosis with pain  -Nails palliatively debrided as below. -Educated on self-care  Procedure: Nail Debridement Rationale: pain  Type of Debridement: manual, sharp debridement. Instrumentation: Nail nipper, rotary burr. Number of Nails: 10  Procedures and Treatment: Consent by patient was obtained for treatment procedures. The patient understood the discussion of treatment and procedures well. All questions were answered thoroughly reviewed. Debridement of mycotic and hypertrophic toenails, 1 through 5 bilateral and clearing of subungual debris. No ulceration, no infection noted.  Return  Visit-Office Procedure: Patient instructed to return to the office for a follow up visit 3 months for continued evaluation and treatment.  Nicholes Rough, DPM    No follow-ups on file.

## 2023-07-17 ENCOUNTER — Ambulatory Visit (HOSPITAL_COMMUNITY)
Admission: RE | Admit: 2023-07-17 | Discharge: 2023-07-17 | Disposition: A | Discharge status: Home / Self Care | Payer: Medicare Other | Source: Ambulatory Visit | Attending: Family Medicine | Admitting: Family Medicine

## 2023-07-17 ENCOUNTER — Encounter (HOSPITAL_COMMUNITY): Payer: Self-pay

## 2023-07-17 DIAGNOSIS — N281 Cyst of kidney, acquired: Secondary | ICD-10-CM | POA: Insufficient documentation

## 2023-07-17 DIAGNOSIS — I700 Atherosclerosis of aorta: Secondary | ICD-10-CM | POA: Diagnosis not present

## 2023-07-17 DIAGNOSIS — R634 Abnormal weight loss: Secondary | ICD-10-CM | POA: Diagnosis present

## 2023-07-17 DIAGNOSIS — J948 Other specified pleural conditions: Secondary | ICD-10-CM | POA: Diagnosis not present

## 2023-07-17 LAB — POCT I-STAT CREATININE: Creatinine, Ser: 1.7 mg/dL — ABNORMAL HIGH (ref 0.61–1.24)

## 2023-07-17 MED ORDER — IOHEXOL 300 MG/ML  SOLN
100.0000 mL | Freq: Once | INTRAMUSCULAR | Status: AC | PRN
  Administered 2023-07-17: 80 mL via INTRAVENOUS

## 2023-08-08 ENCOUNTER — Emergency Department (HOSPITAL_COMMUNITY)
Admission: EM | Admit: 2023-08-08 | Discharge: 2023-08-08 | Disposition: A | Discharge status: Home / Self Care | Payer: Medicare Other | Attending: Emergency Medicine | Admitting: Emergency Medicine

## 2023-08-08 ENCOUNTER — Other Ambulatory Visit: Payer: Self-pay

## 2023-08-08 ENCOUNTER — Encounter (HOSPITAL_COMMUNITY): Payer: Self-pay

## 2023-08-08 DIAGNOSIS — Z20822 Contact with and (suspected) exposure to covid-19: Secondary | ICD-10-CM | POA: Diagnosis not present

## 2023-08-08 DIAGNOSIS — Z7982 Long term (current) use of aspirin: Secondary | ICD-10-CM | POA: Diagnosis not present

## 2023-08-08 DIAGNOSIS — J029 Acute pharyngitis, unspecified: Secondary | ICD-10-CM | POA: Diagnosis present

## 2023-08-08 DIAGNOSIS — Z79899 Other long term (current) drug therapy: Secondary | ICD-10-CM | POA: Insufficient documentation

## 2023-08-08 DIAGNOSIS — I10 Essential (primary) hypertension: Secondary | ICD-10-CM | POA: Insufficient documentation

## 2023-08-08 LAB — COMPREHENSIVE METABOLIC PANEL
ALT: 20 U/L (ref 0–44)
AST: 20 U/L (ref 15–41)
Albumin: 4.4 g/dL (ref 3.5–5.0)
Alkaline Phosphatase: 62 U/L (ref 38–126)
Anion gap: 7 (ref 5–15)
BUN: 24 mg/dL — ABNORMAL HIGH (ref 8–23)
CO2: 30 mmol/L (ref 22–32)
Calcium: 9.2 mg/dL (ref 8.9–10.3)
Chloride: 95 mmol/L — ABNORMAL LOW (ref 98–111)
Creatinine, Ser: 1.33 mg/dL — ABNORMAL HIGH (ref 0.61–1.24)
GFR, Estimated: 53 mL/min — ABNORMAL LOW (ref >60)
Glucose, Bld: 98 mg/dL (ref 70–99)
Potassium: 4.6 mmol/L (ref 3.5–5.1)
Sodium: 132 mmol/L — ABNORMAL LOW (ref 135–145)
Total Bilirubin: 0.5 mg/dL (ref 0.3–1.2)
Total Protein: 7.9 g/dL (ref 6.5–8.1)

## 2023-08-08 LAB — CBC WITH DIFFERENTIAL/PLATELET
Abs Immature Granulocytes: 0.01 10*3/uL (ref 0.00–0.07)
Basophils Absolute: 0 10*3/uL (ref 0.0–0.1)
Basophils Relative: 1 %
Eosinophils Absolute: 0.2 10*3/uL (ref 0.0–0.5)
Eosinophils Relative: 4 %
HCT: 44.5 % (ref 39.0–52.0)
Hemoglobin: 14.8 g/dL (ref 13.0–17.0)
Immature Granulocytes: 0 %
Lymphocytes Relative: 52 %
Lymphs Abs: 2.2 10*3/uL (ref 0.7–4.0)
MCH: 31.5 pg (ref 26.0–34.0)
MCHC: 33.3 g/dL (ref 30.0–36.0)
MCV: 94.7 fL (ref 80.0–100.0)
Monocytes Absolute: 0.5 10*3/uL (ref 0.1–1.0)
Monocytes Relative: 13 %
Neutro Abs: 1.3 10*3/uL — ABNORMAL LOW (ref 1.7–7.7)
Neutrophils Relative %: 30 %
Platelets: 189 10*3/uL (ref 150–400)
RBC: 4.7 MIL/uL (ref 4.22–5.81)
RDW: 12.2 % (ref 11.5–15.5)
WBC: 4.3 10*3/uL (ref 4.0–10.5)
nRBC: 0 % (ref 0.0–0.2)

## 2023-08-08 LAB — SARS CORONAVIRUS 2 BY RT PCR: SARS Coronavirus 2 by RT PCR: NEGATIVE

## 2023-08-08 LAB — GROUP A STREP BY PCR: Group A Strep by PCR: NOT DETECTED

## 2023-08-08 MED ORDER — FLUTICASONE PROPIONATE 50 MCG/ACT NA SUSP: 2.0000 | Freq: Every day | NASAL | 0 refills | Status: AC

## 2023-08-08 NOTE — ED Provider Notes (Signed)
[place] EMERGENCY DEPARTMENT AT [facility] Provider Note   CSN: [id number] Arrival date & time: 08/08/23  1330     History  Chief Complaint  Patient presents with   Sore Throat    Dennis Ruiz is a 82 y.o. male.Pt complains of sore throat, dry nose, fatigue X 1 week not improved with OTC meds.  He denies fever or chills, no trouble swallowing or breathing, no nausea or vomiting, no chest pain, no shortness of breath.  He has PMH of BPH, anxiety, GERD, hypertension heart disease   Sore Throat       Home Medications Prior to Admission medications   Medication Sig Start Date End Date Taking? Authorizing Provider  fluticasone (FLONASE) 50 MCG/ACT nasal spray Place 2 sprays into both nostrils daily. 08/08/23  Yes Tiarna Koppen A, PA-C  aspirin EC 81 MG tablet Take 81 mg by mouth every morning.    [provider]  Cholecalciferol (VITAMIN D PO) Take 2 tablets by mouth daily. 10,000IUs daily    [provider]  diphenhydrAMINE (BENADRYL) 25 MG tablet Take 1 tablet (25 mg total) by mouth every 6 (six) hours. 04/09/21   Burgess Amor, PA-C  famotidine (PEPCID) 20 MG tablet Take 20 mg by mouth at bedtime.    [provider]  HYDROcodone-acetaminophen (NORCO) 7.5-325 MG tablet Take 0.5 tablets by mouth every 6 (six) hours as needed for moderate pain.     [provider]  mirabegron ER (MYRBETRIQ) 25 MG TB24 tablet Take 1 tablet (25 mg total) by mouth daily. start with the 25 mg and increase to 50 mg if needed Patient not taking: Reported on 02/05/2023 11/21/21   Bjorn Pippin, MD  polyethylene glycol (MIRALAX / GLYCOLAX) 17 g packet Take 17 g by mouth daily. 06/18/23   Gareth Eagle, PA-C  temazepam (RESTORIL) 30 MG capsule Take 30 mg by mouth at bedtime as needed for sleep.  09/05/15   [provider]  triamterene-hydrochlorothiazide (MAXZIDE-25) 37.5-25 MG tablet Take 1 tablet by mouth daily. 01/07/21   Elenore Paddy, NP  VITAMIN E  PO Take 1 tablet by mouth every morning.    [provider]      Allergies    Patient has no known allergies.    Review of Systems   Review of Systems  Physical Exam Updated Vital Signs BP 120/67 (BP Location: Right Arm)   Pulse 62   Temp (!) 97.5 F (36.4 C)   Resp 17   Ht 6\' 2"  (1.88 m)   Wt 81.6 kg   SpO2 98%   BMI 23.11 kg/m  Physical Exam Vitals and nursing note reviewed.  Constitutional:      General: He is not in acute distress.    Appearance: He is well-developed.  HENT:     Head: Normocephalic and atraumatic.     Mouth/Throat:     Mouth: Mucous membranes are moist.     Pharynx: Posterior oropharyngeal erythema present. No pharyngeal swelling, oropharyngeal exudate or uvula swelling.     Tonsils: No tonsillar exudate.  Eyes:     Conjunctiva/sclera: Conjunctivae normal.  Cardiovascular:     Rate and Rhythm: Normal rate and regular rhythm.     Heart sounds: No murmur heard. Pulmonary:     Effort: Pulmonary effort is normal. No respiratory distress.     Breath sounds: Normal breath sounds.  Abdominal:     Palpations: Abdomen is soft.     Tenderness: There is no  abdominal tenderness.  Musculoskeletal:        General: No swelling.     Cervical back: Neck supple.  Skin:    General: Skin is warm and dry.     Capillary Refill: Capillary refill takes less than 2 seconds.  Neurological:     General: No focal deficit present.     Mental Status: He is alert and oriented to person, place, and time.  Psychiatric:        Mood and Affect: Mood normal.     ED Results / Procedures / Treatments   Labs (all labs ordered are listed, but only abnormal results are displayed) Labs Reviewed  COMPREHENSIVE METABOLIC PANEL - Abnormal; Notable for the following components:      Result Value   Sodium 132 (*)    Chloride 95 (*)    BUN 24 (*)    Creatinine, Ser 1.33 (*)    GFR, Estimated 53 (*)    All other components within normal limits  CBC WITH  DIFFERENTIAL/PLATELET - Abnormal; Notable for the following components:   Neutro Abs 1.3 (*)    All other components within normal limits  SARS CORONAVIRUS 2 BY RT PCR  GROUP A STREP BY PCR    EKG None  Radiology No results found.  Procedures Procedures    Medications Ordered in ED Medications - No data to display  ED Course/ Medical Decision Making/ A&P                                 Medical Decision Making Ddx: pharyngitis, URI, tonsillitis, electrolyte derangement, other  ED course.  Patient presents to ER for several days of dry nose, sinus congestion, sore throat and fatigue.  He is overall well-appearing, mild pharyngeal erythema and postnasal drip noted on exam, negative COVID negative strep, basic labs ordered due to patient's vague symptoms and advanced age and these are all reassuring, discussed likely viral versus allergic pharyngitis, will treat supportively advised on close follow-up with strict return precautions.  Amount and/or Complexity of Data Reviewed External Data Reviewed: notes. Labs: ordered. Decision-making details documented in ED Course.           Final Clinical Impression(s) / ED Diagnoses Final diagnoses:  Pharyngitis, unspecified etiology    Rx / DC Orders ED Discharge Orders          Ordered    fluticasone (FLONASE) 50 MCG/ACT nasal spray  Daily        08/08/23 1832              Josem Kaufmann 08/08/23 2304    Eber Hong, MD 08/08/23 2311

## 2023-08-08 NOTE — Discharge Instructions (Signed)
Pleasure take care of you today.  Your blood work, strep test and COVID test were all reassuring.  Symptoms could be due to allergies or a viral illness.  We are giving you nasal steroid spray which may help with your sore throat and congestion.  Is important to follow-up closely with your primary care doctor especially if you are not feeling better by Monday if you develop fever, cough, shortness of breath, or any other worrisome changes come back to the ER right away.

## 2023-08-08 NOTE — ED Triage Notes (Signed)
Pt comes in with sore throat and feeling week for a week. Pt states feeling like his feet are on fire. Pt states decrease in appetite.

## 2023-08-08 NOTE — ED Provider Triage Note (Signed)
Emergency Medicine Provider Triage Evaluation Note  Dennis Ruiz , a 82 y.o. male  was evaluated in triage.  Pt complains of sore throat, dry nose, fatigue X 1 week not improved with OTC meds.  Review of Systems  Positive: Sore throat, congestion Negative: Fever, chest pain, vomiting  Physical Exam  BP 120/67 (BP Location: Right Arm)   Pulse 62   Temp (!) 97.5 F (36.4 C)   Resp 17   Ht 6\' 2"  (1.88 m)   Wt 81.6 kg   SpO2 98%   BMI 23.11 kg/m  Gen:   Awake, no distress   Resp:  Normal effort  MSK:   Moves extremities without difficulty  Other:  Mild erythema posterior pharynx, speaks in full clear sentences and handles oral secretions well no oral or pharyngeal swelling noted  Medical Decision Making  Medically screening exam initiated at 2:08 PM.  Appropriate orders placed.  Darylene Price was informed that the remainder of the evaluation will be completed by another provider, this initial triage assessment does not replace that evaluation, and the importance of remaining in the ED until their evaluation is complete.     Carmel Sacramento A, PA-C 08/08/23 1409

## 2023-08-21 NOTE — Progress Notes (Unsigned)
GI Office Note    Referring Provider: Leone Payor, FNP Primary Care Physician:  Leone Payor, FNP  Primary Gastroenterologist: Hennie Duos. Marletta Lor, DO  Chief Complaint   Chief Complaint  Patient presents with   Abdominal Pain    Throat feels like it is closed up. Stomach hurts all the time. Has constipation. Weak and losing weight    History of Present Illness   Dennis Ruiz is a 82 y.o. male presenting today at the request of Leone Payor, FNP for lack of appetite/anorexia.   CT chest, abdomen, pelvis 07/17/2023: -No acute abnormality, lymphadenopathy, or suspicious mass in the chest, abdomen, or pelvis -Bilateral calcified pleural plaques -3.3 cm right renal cyst, likely benign, no follow-up imaging recommended  ED visit 10/19 with c/o sore throat, dry nose, and fatigue for 1 week with no improvement with over-the-counter medications.  Noted to have postnasal drip on exam.  Negative for strep and COVID.  Suspected to be viral allergic pharyngitis.  Was discharged with Flonase prescription.  CMP with sodium 132, BUN 24, creatinine 1.33, GFR 53.  CBC within normal limits.  Per review of chart he had a similar presentation in August 2024.  He also reported poor appetite and and also reported some issues with constipation.  Admitted to intermittent narcotic use.  Advised to begin MiraLAX daily to help with constipation.  Advised viral process in regards to sore throat.   Today:  Having dry cough, dry throat, decreased appetite. He also reports weight loss. Feeling very weak. Not sleeping well. Taking restoril. Symptoms present for about 1 month. No exposure to flu or COVID. Reports he was negative for strep throat. Has sen Dr. Marcelo Baldy office who did xray and Ct scan he reports.   Taking an otc laxative, unsure what kind (pink pill). (Bisacodyl). Takes this every 3-4 days because he has not been able to go.   Throat dry at night and makes him feel like he can't swallow. Sometimes  he tries coconut oil and water but then throat will be very sore. Has been having trouble swallowing just about anything on a frequent basis. Has been making himself eat.   Taking pepto tablets once every other day or so when he feels very gassy. Avoids fatty greasy and fried food, bakes mst of his food.   Has a brother that is 58 years old.  Takes tylenol. Avoids NSAIDS, only baby aspirin. No melena or brbpr.   Has never had na EGD. No chest pain or shortness of breath.   Feet and fingers numb and having cloudy vision.   He reports many years ago. He reports a history of a few. Last one he had was in [place].   Going to see a prostate doctor this month.    Wt Readings from Last 3 Encounters:  08/24/23 188 lb (85.3 kg)  08/08/23 180 lb (81.6 kg)  06/18/23 187 lb (84.8 kg)    Current Outpatient Medications  Medication Sig Dispense Refill   aspirin EC 81 MG tablet Take 81 mg by mouth every morning.     Cholecalciferol (VITAMIN D PO) Take 2 tablets by mouth daily. 10,000IUs daily     famotidine (PEPCID) 20 MG tablet Take 20 mg by mouth at bedtime.     HYDROcodone-acetaminophen (NORCO) 7.5-325 MG tablet Take 0.5 tablets by mouth every 6 (six) hours as needed for moderate pain.      linaclotide (LINZESS) 145 MCG CAPS capsule Take 1 capsule every day by oral  route as directed.     polyethylene glycol (MIRALAX / GLYCOLAX) 17 g packet Take 17 g by mouth daily. 14 each 0   temazepam (RESTORIL) 30 MG capsule Take 30 mg by mouth at bedtime as needed for sleep.      triamterene-hydrochlorothiazide (MAXZIDE-25) 37.5-25 MG tablet Take 1 tablet by mouth daily. 90 tablet 3   VITAMIN E PO Take 1 tablet by mouth every morning.     diphenhydrAMINE (BENADRYL) 25 MG tablet Take 1 tablet (25 mg total) by mouth every 6 (six) hours. (Patient not taking: Reported on 08/24/2023) 20 tablet 0   fluticasone (FLONASE) 50 MCG/ACT nasal spray Place 2 sprays into both nostrils daily. (Patient not taking: Reported on  08/24/2023) 11.1 mL 0   mirabegron ER (MYRBETRIQ) 25 MG TB24 tablet Take 1 tablet (25 mg total) by mouth daily. start with the 25 mg and increase to 50 mg if needed (Patient not taking: Reported on 02/05/2023) 30 tablet 11   No current facility-administered medications for this visit.    Past Medical History:  Diagnosis Date   Anxiety    Back pain    Chest pain    Heart burn    Heart disease    Hypertension     Past Surgical History:  Procedure Laterality Date   HERNIA REPAIR     TRANSURETHRAL RESECTION OF PROSTATE      Family History  Problem Relation Age of Onset   Cancer Father     Allergies as of 08/24/2023   (No Known Allergies)    Social History   Socioeconomic History   Marital status: Married    Spouse name: Not on file   Number of children: Not on file   Years of education: Not on file   Highest education level: Not on file  Occupational History   Not on file  Tobacco Use   Smoking status: Every Day    Current packs/day: 1.00    Types: Cigarettes   Smokeless tobacco: Never   Tobacco comments:    pt reprots pack every two weeks.  Vaping Use   Vaping status: Never Used  Substance and Sexual Activity   Alcohol use: Yes    Comment: socail   Drug use: No   Sexual activity: Not on file  Other Topics Concern   Not on file  Social History Narrative   Widowed   No regular exercise   Social Determinants of Health   Financial Resource Strain: Not on file  Food Insecurity: Not on file  Transportation Needs: Not on file  Physical Activity: Not on file  Stress: Not on file  Social Connections: Not on file  Intimate Partner Violence: Not on file     Review of Systems   Gen: + fatigue, weight loss, lack of appetite. Denies any fever, chills.  CV: Denies chest pain, heart palpitations, peripheral edema, syncope.  Resp: + throat soreness. Denies shortness of breath at rest or with exertion. Denies wheezing or cough.  GI: see HPI GU : Denies urinary  burning, urinary frequency, urinary hesitancy MS: + joint pain and weakness. Denies joint pain, muscle weakness, cramps, or limitation of movement.  Derm: Denies rash, itching, dry skin Psych: Denies depression, anxiety, memory loss, and confusion Heme: Denies bruising, bleeding, and enlarged lymph nodes.   Physical Exam   BP 123/83 (BP Location: Right Arm, Patient Position: Sitting, Cuff Size: Large)   Pulse 72   Temp 97.9 F (36.6 C) (Temporal)   Ht 6'  2" (1.88 m)   Wt 188 lb (85.3 kg)   BMI 24.14 kg/m   General:   Alert and oriented. Pleasant and cooperative. Well-nourished and well-developed.  Head:  Normocephalic and atraumatic. Eyes:  Without icterus, sclera clear and conjunctiva pink.  Ears:  Normal auditory acuity. Mouth: Unable to assess, mask in place. Lungs:  Clear to auscultation bilaterally. No wheezes, rales, or rhonchi. No distress.  Heart:  S1, S2 present without murmurs appreciated.  Abdomen:  +BS, soft, non-tender and non-distended. No HSM noted. No guarding or rebound. No masses appreciated.  Rectal:  Deferred  Msk: Weak gait.  Uses single-point cane. Extremities:  Without edema. Neurologic:  Alert and  oriented x4;  grossly normal neurologically. Skin:  Intact without significant lesions or rashes. Psych:  Alert and cooperative. Normal mood and affect.   Assessment   Dennis Ruiz is a 82 y.o. male with a history of GERD, BPH s/p TURP in 2012, anxiety, HTN, and heart disease presenting today for evaluation of lack of appetite and dysphagia.  Lack of appetite/anorexia, weight loss:  GERD, dysphagia:   Constipation:    PLAN   *** CRP, TSH, B12, iron panel, Hep C Ab EGD if no improvement with PPI Omeprazole 40 mg once daily Continue famotidine 20 mg once daily Start MiraLAX 17 g once daily Start a daily allergy medication. Request prior colonoscopy records from [facility] GI Follow-up in 2 months, sooner if needed.   Brooke Bonito, MSN,  FNP-BC, AGACNP-BC [facility]

## 2023-08-24 ENCOUNTER — Ambulatory Visit (INDEPENDENT_AMBULATORY_CARE_PROVIDER_SITE_OTHER): Payer: Medicare Other | Admitting: Gastroenterology

## 2023-08-24 ENCOUNTER — Encounter: Payer: Self-pay | Admitting: Gastroenterology

## 2023-08-24 VITALS — BP 123/83 | HR 72 | Temp 97.9°F | Ht 74.0 in | Wt 188.0 lb

## 2023-08-24 DIAGNOSIS — R630 Anorexia: Secondary | ICD-10-CM

## 2023-08-24 DIAGNOSIS — K219 Gastro-esophageal reflux disease without esophagitis: Secondary | ICD-10-CM | POA: Diagnosis not present

## 2023-08-24 DIAGNOSIS — R1310 Dysphagia, unspecified: Secondary | ICD-10-CM

## 2023-08-24 DIAGNOSIS — K5900 Constipation, unspecified: Secondary | ICD-10-CM

## 2023-08-24 DIAGNOSIS — R5383 Other fatigue: Secondary | ICD-10-CM

## 2023-08-24 MED ORDER — OMEPRAZOLE 40 MG PO CPDR: 40.0000 mg | DELAYED_RELEASE_CAPSULE | Freq: Every day | ORAL | 3 refills | Status: DC

## 2023-08-24 NOTE — Patient Instructions (Addendum)
I have ordered lab work for you today.  Please take the slips with you to [facility] which is at [employer]. in the two-story building across the street from [facility] emergency department.  If they are closed you may go to the hospital lab to have your labs drawn.  I have sent in omeprazole for you to take 40 mg once daily, 30 minutes prior to breakfast.  Follow a GERD diet:  Avoid fried, fatty, greasy, spicy, citrus foods. Avoid caffeine and carbonated beverages. Avoid chocolate. Try eating 4-6 small meals a day rather than 3 large meals. Do not eat within 3 hours of laying down. Prop head of bed up on wood or bricks to create a 6 inch incline.  You can continue taking your famotidine 20 mg once daily.  Start MiraLAX 17 g once daily mixed in 8 ounces of water.  I will see for follow-up in 2 months, sooner if needed.  If you continue to have symptoms we will consider an upper endoscopy.  Start a daily over-the-counter allergy medication such as Allegra or Zyrtec.  This can likely help with the drainage you are experiencing down the back of your throat and the intermittent dry cough.  Please fill out a records request form at the front desk for [place] to get your prior colonoscopy records from [facility].  It was a pleasure to see you today. I want to create trusting relationships with patients. If you receive a survey regarding your visit,  I greatly appreciate you taking time to fill this out on paper or through your MyChart. I value your feedback.  Brooke Bonito, MSN, FNP-BC, AGACNP-BC [facility]

## 2023-08-25 LAB — C-REACTIVE PROTEIN: CRP: <3 mg/L (ref <8.0)

## 2023-08-25 LAB — HEPATITIS C ANTIBODY: Hepatitis C Ab: NONREACTIVE

## 2023-08-25 LAB — IRON,TIBC AND FERRITIN PANEL
%SAT: 26 % (ref 20–48)
Ferritin: 165 ng/mL (ref 24–380)
Iron: 84 ug/dL (ref 50–180)
TIBC: 324 ug/dL (ref 250–425)

## 2023-08-25 LAB — TSH+FREE T4: TSH W/REFLEX TO FT4: 0.72 m[IU]/L (ref 0.40–4.50)

## 2023-08-25 LAB — B12 AND FOLATE PANEL
Folate: 22.8 ng/mL
Vitamin B-12: 965 pg/mL (ref 200–1100)

## 2023-08-27 ENCOUNTER — Encounter: Payer: Self-pay | Admitting: Gastroenterology

## 2023-08-27 ENCOUNTER — Other Ambulatory Visit: Payer: Self-pay | Admitting: Gastroenterology

## 2023-08-27 MED ORDER — PANTOPRAZOLE SODIUM 40 MG PO TBEC: 40.0000 mg | DELAYED_RELEASE_TABLET | Freq: Every day | ORAL | 1 refills | Status: AC

## 2023-10-23 NOTE — Progress Notes (Deleted)
 GI Office Note    Referring Provider: Aura Portal, FNP Primary Care Physician:  Aura Portal, FNP (Inactive) Primary Gastroenterologist: Carlin POUR. Cindie, DO  Date:  10/23/2023  ID:  Dennis Ruiz, DOB 1941-10-02, MRN [id number]   Chief Complaint   No chief complaint on file.  History of Present Illness  Dennis Ruiz is a 83 y.o. male with a history of BPH s/p TURP in 2012, GERD, anxiety, HTN, heart disease presenting today for follow-up of weight loss, lack of appetite, dysphagia, and constipation.  CT chest, abdomen, pelvis 07/17/2023: -No acute abnormality, lymphadenopathy, or suspicious mass in the chest, abdomen, or pelvis -Bilateral calcified pleural plaques -3.3 cm right renal cyst, likely benign, no follow-up imaging recommended   ED visit 10/19 with c/o sore throat, dry nose, and fatigue for 1 week with no improvement with over-the-counter medications.  Noted to have postnasal drip on exam.  Negative for strep and COVID.  Suspected to be viral allergic pharyngitis.  Was discharged with Flonase  prescription.  CMP with sodium 132, BUN 24, creatinine 1.33, GFR 53.  CBC within normal limits.  Per review of chart he had a similar presentation in August 2024.  He also reported poor appetite and and also reported some issues with constipation.  Admitted to intermittent narcotic use.  Advised to begin MiraLAX  daily to help with constipation.  Advised viral process in regards to sore throat.   Initial office visit 08/24/2023.  Patient reported a dry throat, dry cough and decreased appetite as well as weight loss, weakness, insomnia for about a month.  Taking Restoril to help with sleep.  Has been taking an over-the-counter laxative, possibly bisacodyl every 3-4 days given constipation.  Having a dry throat at night makes him feel like he cannot swallow.  Has been unable to swallow solids and liquids very well.  Denies NSAIDs.  Never had a EGD.  Tries to avoid fatty and fried foods.  Has  taken Pepto-Bismol when gassy.  Labs -CRP, TSH, B12, iron panel, hep C.  Omeprazole  40 mg daily, famotidine  20 mg daily.  Start MiraLAX  17 g once daily and daily allergy medication.  Requested prior colonoscopy records from [facility].  Advise EGD if no improvement with PPI.  Today:    Wt Readings from Last 3 Encounters:  08/24/23 188 lb (85.3 kg)  08/08/23 180 lb (81.6 kg)  06/18/23 187 lb (84.8 kg)  06/18/2023 187 lb  Current Outpatient Medications  Medication Sig Dispense Refill   aspirin  EC 81 MG tablet Take 81 mg by mouth every morning.     Cholecalciferol (VITAMIN D PO) Take 2 tablets by mouth daily. 10,000IUs daily     diphenhydrAMINE  (BENADRYL ) 25 MG tablet Take 1 tablet (25 mg total) by mouth every 6 (six) hours. (Patient not taking: Reported on 08/24/2023) 20 tablet 0   fluticasone  (FLONASE ) 50 MCG/ACT nasal spray Place 2 sprays into both nostrils daily. (Patient not taking: Reported on 08/24/2023) 11.1 mL 0   HYDROcodone -acetaminophen  (NORCO) 7.5-325 MG tablet Take 0.5 tablets by mouth every 6 (six) hours as needed for moderate pain.      linaclotide (LINZESS) 145 MCG CAPS capsule Take 1 capsule every day by oral route as directed.     mirabegron  ER (MYRBETRIQ ) 25 MG TB24 tablet Take 1 tablet (25 mg total) by mouth daily. start with the 25 mg and increase to 50 mg if needed (Patient not taking: Reported on 02/05/2023) 30 tablet 11   pantoprazole  (PROTONIX ) 40 MG  tablet Take 1 tablet (40 mg total) by mouth daily. 90 tablet 1   polyethylene glycol (MIRALAX  / GLYCOLAX ) 17 g packet Take 17 g by mouth daily. 14 each 0   temazepam (RESTORIL) 30 MG capsule Take 30 mg by mouth at bedtime as needed for sleep.      triamterene -hydrochlorothiazide (MAXZIDE-25) 37.5-25 MG tablet Take 1 tablet by mouth daily. 90 tablet 3   VITAMIN E PO Take 1 tablet by mouth every morning.     No current facility-administered medications for this visit.    Past Medical History:  Diagnosis Date   Anxiety     Back pain    Chest pain    Heart burn    Heart disease    Hypertension     Past Surgical History:  Procedure Laterality Date   HERNIA REPAIR     TRANSURETHRAL RESECTION OF PROSTATE      Family History  Problem Relation Age of Onset   Cancer Father    Colonic polyp Neg Hx    Cancer - Colon Neg Hx     Allergies as of 10/26/2023   (No Known Allergies)    Social History   Socioeconomic History   Marital status: Married    Spouse name: Not on file   Number of children: Not on file   Years of education: Not on file   Highest education level: Not on file  Occupational History   Not on file  Tobacco Use   Smoking status: Every Day    Current packs/day: 1.00    Types: Cigarettes   Smokeless tobacco: Never   Tobacco comments:    pt reprots pack every two weeks.  Vaping Use   Vaping status: Never Used  Substance and Sexual Activity   Alcohol use: Yes    Comment: socail   Drug use: No   Sexual activity: Not on file  Other Topics Concern   Not on file  Social History Narrative   Widowed   No regular exercise   Social Drivers of Health   Financial Resource Strain: Not on file  Food Insecurity: Not on file  Transportation Needs: Not on file  Physical Activity: Not on file  Stress: Not on file  Social Connections: Not on file     Review of Systems   Gen: Denies fever, chills, anorexia. Denies fatigue, weakness, weight loss.  CV: Denies chest pain, palpitations, syncope, peripheral edema, and claudication. Resp: Denies dyspnea at rest, cough, wheezing, coughing up blood, and pleurisy. GI: See HPI Derm: Denies rash, itching, dry skin Psych: Denies depression, anxiety, memory loss, confusion. No homicidal or suicidal ideation.  Heme: Denies bruising, bleeding, and enlarged lymph nodes.  Physical Exam   There were no vitals taken for this visit.  General:   Alert and oriented. No distress noted. Pleasant and cooperative.  Head:  Normocephalic and  atraumatic. Eyes:  Conjuctiva clear without scleral icterus. Mouth:  Oral mucosa pink and moist. Good dentition. No lesions. Lungs:  Clear to auscultation bilaterally. No wheezes, rales, or rhonchi. No distress.  Heart:  S1, S2 present without murmurs appreciated.  Abdomen:  +BS, soft, non-tender and non-distended. No rebound or guarding. No HSM or masses noted. Rectal: *** Msk:  Symmetrical without gross deformities. Normal posture. Extremities:  Without edema. Neurologic:  Alert and  oriented x4 Psych:  Alert and cooperative. Normal mood and affect.  Assessment  Dennis Ruiz is a 83 y.o. male with a history of BPH s/p TURP in  2012, GERD, anxiety, HTN, heart disease presenting today for follow-up of weight loss, lack of appetite, dysphagia, and constipation.  Lack of appetite/anorexia, weight loss:   GERD, dysphagia, odynophagia:   Constipation:   PLAN   ***     Charmaine Melia, MSN, FNP-BC, AGACNP-BC [facility]

## 2023-10-26 ENCOUNTER — Ambulatory Visit: Payer: Medicare Other | Admitting: Gastroenterology

## 2023-10-26 ENCOUNTER — Telehealth: Payer: Self-pay | Admitting: Gastroenterology

## 2023-10-26 NOTE — Telephone Encounter (Signed)
 Patient was scheduled for this morning but due to weather we needed to reschedule his appointment. I called [phone] and LMOM for a return call to reschedule his OV.

## 2024-04-11 ENCOUNTER — Emergency Department (HOSPITAL_COMMUNITY)
Admission: EM | Admit: 2024-04-11 | Discharge: 2024-04-11 | Disposition: A | Discharge status: Home / Self Care | Attending: Emergency Medicine | Admitting: Emergency Medicine

## 2024-04-11 ENCOUNTER — Other Ambulatory Visit: Payer: Self-pay

## 2024-04-11 DIAGNOSIS — N189 Chronic kidney disease, unspecified: Secondary | ICD-10-CM | POA: Insufficient documentation

## 2024-04-11 DIAGNOSIS — G629 Polyneuropathy, unspecified: Secondary | ICD-10-CM | POA: Diagnosis not present

## 2024-04-11 DIAGNOSIS — I129 Hypertensive chronic kidney disease with stage 1 through stage 4 chronic kidney disease, or unspecified chronic kidney disease: Secondary | ICD-10-CM | POA: Insufficient documentation

## 2024-04-11 DIAGNOSIS — R200 Anesthesia of skin: Secondary | ICD-10-CM | POA: Diagnosis present

## 2024-04-11 DIAGNOSIS — Z79899 Other long term (current) drug therapy: Secondary | ICD-10-CM | POA: Diagnosis not present

## 2024-04-11 DIAGNOSIS — Z7982 Long term (current) use of aspirin: Secondary | ICD-10-CM | POA: Diagnosis not present

## 2024-04-11 LAB — CBC WITH DIFFERENTIAL/PLATELET
Abs Immature Granulocytes: 0.01 10*3/uL (ref 0.00–0.07)
Basophils Absolute: 0 10*3/uL (ref 0.0–0.1)
Basophils Relative: 1 %
Eosinophils Absolute: 0.1 10*3/uL (ref 0.0–0.5)
Eosinophils Relative: 3 %
HCT: 42.6 % (ref 39.0–52.0)
Hemoglobin: 14.6 g/dL (ref 13.0–17.0)
Immature Granulocytes: 0 %
Lymphocytes Relative: 44 %
Lymphs Abs: 2 10*3/uL (ref 0.7–4.0)
MCH: 32.7 pg (ref 26.0–34.0)
MCHC: 34.3 g/dL (ref 30.0–36.0)
MCV: 95.5 fL (ref 80.0–100.0)
Monocytes Absolute: 0.7 10*3/uL (ref 0.1–1.0)
Monocytes Relative: 15 %
Neutro Abs: 1.7 10*3/uL (ref 1.7–7.7)
Neutrophils Relative %: 37 %
Platelets: 146 10*3/uL — ABNORMAL LOW (ref 150–400)
RBC: 4.46 MIL/uL (ref 4.22–5.81)
RDW: 12.4 % (ref 11.5–15.5)
WBC: 4.5 10*3/uL (ref 4.0–10.5)
nRBC: 0 % (ref 0.0–0.2)

## 2024-04-11 LAB — FOLATE: Folate: 25.1 ng/mL (ref >5.9)

## 2024-04-11 LAB — COMPREHENSIVE METABOLIC PANEL WITH GFR
ALT: 17 U/L (ref 0–44)
AST: 21 U/L (ref 15–41)
Albumin: 4.1 g/dL (ref 3.5–5.0)
Alkaline Phosphatase: 64 U/L (ref 38–126)
Anion gap: 14 (ref 5–15)
BUN: 24 mg/dL — ABNORMAL HIGH (ref 8–23)
CO2: 26 mmol/L (ref 22–32)
Calcium: 9.5 mg/dL (ref 8.9–10.3)
Chloride: 99 mmol/L (ref 98–111)
Creatinine, Ser: 1.42 mg/dL — ABNORMAL HIGH (ref 0.61–1.24)
GFR, Estimated: 49 mL/min — ABNORMAL LOW (ref >60)
Glucose, Bld: 95 mg/dL (ref 70–99)
Potassium: 4.6 mmol/L (ref 3.5–5.1)
Sodium: 139 mmol/L (ref 135–145)
Total Bilirubin: 0.7 mg/dL (ref 0.0–1.2)
Total Protein: 7.7 g/dL (ref 6.5–8.1)

## 2024-04-11 LAB — VITAMIN B12: Vitamin B-12: 664 pg/mL (ref 180–914)

## 2024-04-11 MED ORDER — GABAPENTIN 300 MG PO CAPS: ORAL_CAPSULE | ORAL | 0 refills | Status: AC

## 2024-04-11 MED ORDER — GABAPENTIN 300 MG PO CAPS
300.0000 mg | ORAL_CAPSULE | ORAL | Status: AC
  Administered 2024-04-11: 300 mg via ORAL
  Filled 2024-04-11: qty 1

## 2024-04-11 NOTE — ED Provider Notes (Signed)
 [place] EMERGENCY DEPARTMENT AT [facility] Provider Note   CSN: [id number] Arrival date & time: 04/11/24  1052     Patient presents with: Numbness   Dennis Ruiz is a 83 y.o. male.  {Add pertinent medical, surgical, social history, OB history to HPI:6750} 83 year old male history of chronic pain, peripheral neuropathy, CKD, and hypertension who presents to the emergency department with numbness in his hands and feet.  Reports that this has been ongoing for over a year.  Worsened over the past month.  Says that is making it difficult to walk.  Was previously on gabapentin but reports that he does not take it anymore.  Unaware of any formal diagnosis that would explain his symptoms.  Does not drink alcohol.  No bowel or bladder incontinence.  No saddle anesthesia.  No weakness otherwise.       Prior to Admission medications   Medication Sig Start Date End Date Taking? Authorizing Provider  aspirin  EC 81 MG tablet Take 81 mg by mouth every morning.    [provider]  Cholecalciferol (VITAMIN D PO) Take 2 tablets by mouth daily. 10,000IUs daily    [provider]  diphenhydrAMINE  (BENADRYL ) 25 MG tablet Take 1 tablet (25 mg total) by mouth every 6 (six) hours. Patient not taking: Reported on 08/24/2023 04/09/21   Idol, Julie, PA-C  fluticasone  (FLONASE ) 50 MCG/ACT nasal spray Place 2 sprays into both nostrils daily. Patient not taking: Reported on 08/24/2023 08/08/23   [name] A, PA-C  HYDROcodone -acetaminophen  (NORCO) 7.5-325 MG tablet Take 0.5 tablets by mouth every 6 (six) hours as needed for moderate pain.     [provider]  linaclotide LARUE) 145 MCG CAPS capsule Take 1 capsule every day by oral route as directed. 06/26/23   [provider]  mirabegron  ER (MYRBETRIQ ) 25 MG TB24 tablet Take 1 tablet (25 mg total) by mouth daily. start with the 25 mg and increase to 50 mg if needed Patient not taking: Reported on 02/05/2023  11/21/21   Watt Rush, MD  pantoprazole  (PROTONIX ) 40 MG tablet Take 1 tablet (40 mg total) by mouth daily. 08/27/23   [name], NP  polyethylene glycol (MIRALAX  / GLYCOLAX ) 17 g packet Take 17 g by mouth daily. 06/18/23   Robinson, John K, PA-C  temazepam (RESTORIL) 30 MG capsule Take 30 mg by mouth at bedtime as needed for sleep.  09/05/15   [provider]  triamterene -hydrochlorothiazide (MAXZIDE-25) 37.5-25 MG tablet Take 1 tablet by mouth daily. 01/07/21   Elnor Lauraine BRAVO, NP  VITAMIN E PO Take 1 tablet by mouth every morning.    [provider]    Allergies: Patient has no known allergies.    Review of Systems  Updated Vital Signs BP 124/79 (BP Location: Right Arm)   Pulse 93   Resp 18   Ht 6' 2 (1.88 m)   Wt 85.3 kg   SpO2 95%   BMI 24.14 kg/m   Physical Exam Vitals and nursing note reviewed.  Constitutional:      General: He is not in acute distress.    Appearance: He is well-developed.  HENT:     Head: Normocephalic and atraumatic.     Right Ear: External ear normal.     Left Ear: External ear normal.     Nose: Nose normal.   Eyes:     Extraocular Movements: Extraocular movements intact.     Conjunctiva/sclera: Conjunctivae normal.     Pupils: Pupils  are equal, round, and reactive to light.    Musculoskeletal:     Cervical back: Normal range of motion and neck supple.     Comments: Motor: Muscle bulk and tone are normal. Strength is 5/5 in shoulder abduction, elbow flexion and extension, grip strength, hip flexion, knee flexion and extension, ankle dorsiflexion and plantar flexion bilaterally. Full strength of great toe dorsiflexion bilaterally.  Sensory: Intact sensation to light touch in C5-T1 dermatomes and L2 though S1 dermatomes bilaterally.   Patellar and Achilles reflexes 2+ bilaterally.  No ankle clonus noted.   Skin:    General: Skin is warm and dry.   Neurological:     Mental Status: He is alert. Mental status is at  baseline.   Psychiatric:        Mood and Affect: Mood normal.        Behavior: Behavior normal.     (all labs ordered are listed, but only abnormal results are displayed) Labs Reviewed - No data to display  EKG: None  Radiology: No results found.  {Document cardiac monitor, telemetry assessment procedure when appropriate:32947} Procedures   Medications Ordered in the ED - No data to display    {Click here for ABCD2, HEART and other calculators REFRESH Note before signing:1}                              Medical Decision Making Amount and/or Complexity of Data Reviewed Labs: ordered.  Risk Prescription drug management.   ***  {Document critical care time when appropriate  Document review of labs and clinical decision tools ie CHADS2VASC2, etc  Document your independent review of radiology images and any outside records  Document your discussion with family members, caretakers and with consultants  Document social determinants of health affecting pt's care  Document your decision making why or why not admission, treatments were needed:32947:::1}   Final diagnoses:  None    ED Discharge Orders     None

## 2024-04-11 NOTE — ED Triage Notes (Signed)
 Pt arrived via POV from home with a list in his hand reporting multiple complaints. Pt reports hand and feet are numb, his throat is sore, generalized body weakness, vision changes and hip and testicular pain. Pt reports pain has been on-going for sometime now.

## 2024-04-11 NOTE — Discharge Instructions (Signed)
 You were seen for your hand and foot numbness in the emergency department.  It is likely from neuropathy.  At home, please start taking the gabapentin we prescribed you.  Please note that this can make you drowsy so please stop taking it if you feel that it is making you too tired or off-balance.  Check your MyChart online for the results of any tests that had not resulted by the time you left the emergency department.   Follow-up with your primary doctor in 2-3 days regarding your visit.    Return immediately to the emergency department if you experience any of the following: New numbness or weakness, bowel or bladder incontinence, or any other concerning symptoms.    Thank you for visiting our Emergency Department. It was a pleasure taking care of you today.

## 2024-05-18 ENCOUNTER — Encounter (HOSPITAL_COMMUNITY): Payer: Self-pay

## 2024-05-18 ENCOUNTER — Other Ambulatory Visit: Payer: Self-pay

## 2024-05-18 ENCOUNTER — Emergency Department (HOSPITAL_COMMUNITY)
Admission: EM | Admit: 2024-05-18 | Discharge: 2024-05-18 | Disposition: A | Discharge status: Home / Self Care | Attending: Emergency Medicine | Admitting: Emergency Medicine

## 2024-05-18 DIAGNOSIS — R600 Localized edema: Secondary | ICD-10-CM | POA: Insufficient documentation

## 2024-05-18 DIAGNOSIS — R2240 Localized swelling, mass and lump, unspecified lower limb: Secondary | ICD-10-CM | POA: Diagnosis present

## 2024-05-18 DIAGNOSIS — Z7982 Long term (current) use of aspirin: Secondary | ICD-10-CM | POA: Insufficient documentation

## 2024-05-18 HISTORY — DX: Disorder of kidney and ureter, unspecified: N28.9

## 2024-05-18 LAB — CBC WITH DIFFERENTIAL/PLATELET
Abs Immature Granulocytes: 0.01 K/uL (ref 0.00–0.07)
Basophils Absolute: 0 K/uL (ref 0.0–0.1)
Basophils Relative: 0 %
Eosinophils Absolute: 0.2 K/uL (ref 0.0–0.5)
Eosinophils Relative: 4 %
HCT: 41 % (ref 39.0–52.0)
Hemoglobin: 13.6 g/dL (ref 13.0–17.0)
Immature Granulocytes: 0 %
Lymphocytes Relative: 43 %
Lymphs Abs: 2.1 K/uL (ref 0.7–4.0)
MCH: 31.6 pg (ref 26.0–34.0)
MCHC: 33.2 g/dL (ref 30.0–36.0)
MCV: 95.1 fL (ref 80.0–100.0)
Monocytes Absolute: 0.5 K/uL (ref 0.1–1.0)
Monocytes Relative: 10 %
Neutro Abs: 2.1 K/uL (ref 1.7–7.7)
Neutrophils Relative %: 43 %
Platelets: 208 K/uL (ref 150–400)
RBC: 4.31 MIL/uL (ref 4.22–5.81)
RDW: 12.4 % (ref 11.5–15.5)
WBC: 5 K/uL (ref 4.0–10.5)
nRBC: 0 % (ref 0.0–0.2)

## 2024-05-18 LAB — COMPREHENSIVE METABOLIC PANEL WITH GFR
ALT: 17 U/L (ref 0–44)
AST: 21 U/L (ref 15–41)
Albumin: 4.1 g/dL (ref 3.5–5.0)
Alkaline Phosphatase: 66 U/L (ref 38–126)
Anion gap: 13 (ref 5–15)
BUN: 17 mg/dL (ref 8–23)
CO2: 24 mmol/L (ref 22–32)
Calcium: 9.2 mg/dL (ref 8.9–10.3)
Chloride: 102 mmol/L (ref 98–111)
Creatinine, Ser: 1.2 mg/dL (ref 0.61–1.24)
GFR, Estimated: 60 mL/min — ABNORMAL LOW (ref >60)
Glucose, Bld: 93 mg/dL (ref 70–99)
Potassium: 4.4 mmol/L (ref 3.5–5.1)
Sodium: 139 mmol/L (ref 135–145)
Total Bilirubin: 0.9 mg/dL (ref 0.0–1.2)
Total Protein: 7.7 g/dL (ref 6.5–8.1)

## 2024-05-18 LAB — BRAIN NATRIURETIC PEPTIDE: B Natriuretic Peptide: 38 pg/mL (ref 0.0–100.0)

## 2024-05-18 MED ORDER — TRIAMTERENE-HCTZ 37.5-25 MG PO TABS: 1.0000 | ORAL_TABLET | Freq: Every day | ORAL | 3 refills | Status: AC

## 2024-05-18 NOTE — ED Triage Notes (Signed)
 Pt reports he ran out of his fluid pill about a week and a half ago.

## 2024-05-18 NOTE — Discharge Instructions (Signed)
 Follow-up with your family doctor next week

## 2024-05-18 NOTE — ED Provider Notes (Signed)
 [place] EMERGENCY DEPARTMENT AT [facility] Provider Note   CSN: [id number] Arrival date & time: 05/18/24  1708     Patient presents with: fluid retention   Dennis Ruiz is a 83 y.o. male.  {Add pertinent medical, surgical, social history, OB history to YEP:67052} Patient complains of swelling to his feet.  Patient ran out of his Maxide about a week ago and the swelling began after that   Foot Pain       Prior to Admission medications   Medication Sig Start Date End Date Taking? Authorizing Provider  triamterene -hydrochlorothiazide (MAXZIDE-25) 37.5-25 MG tablet Take 1 tablet by mouth daily. 05/18/24  Yes Kimbery Harwood, MD  aspirin  EC 81 MG tablet Take 81 mg by mouth every morning.    [provider]  Cholecalciferol (VITAMIN D PO) Take 2 tablets by mouth daily. 10,000IUs daily    [provider]  diphenhydrAMINE  (BENADRYL ) 25 MG tablet Take 1 tablet (25 mg total) by mouth every 6 (six) hours. Patient not taking: Reported on 08/24/2023 04/09/21   Idol, Julie, PA-C  fluticasone  (FLONASE ) 50 MCG/ACT nasal spray Place 2 sprays into both nostrils daily. Patient not taking: Reported on 08/24/2023 08/08/23   [name] A, PA-C  gabapentin  (NEURONTIN ) 300 MG capsule Take 1 capsule (300 mg total) by mouth 2 (two) times daily for 1 day, THEN 1 capsule (300 mg total) 3 (three) times daily. 04/11/24 05/12/24  [name], MD  HYDROcodone -acetaminophen  (NORCO) 7.5-325 MG tablet Take 0.5 tablets by mouth every 6 (six) hours as needed for moderate pain.     [provider]  linaclotide LARUE) 145 MCG CAPS capsule Take 1 capsule every day by oral route as directed. 06/26/23   [provider]  mirabegron  ER (MYRBETRIQ ) 25 MG TB24 tablet Take 1 tablet (25 mg total) by mouth daily. start with the 25 mg and increase to 50 mg if needed Patient not taking: Reported on 02/05/2023 11/21/21   Watt Rush, MD  pantoprazole  (PROTONIX ) 40 MG tablet  Take 1 tablet (40 mg total) by mouth daily. 08/27/23   [name], NP  polyethylene glycol (MIRALAX  / GLYCOLAX ) 17 g packet Take 17 g by mouth daily. 06/18/23   Robinson, John K, PA-C  temazepam (RESTORIL) 30 MG capsule Take 30 mg by mouth at bedtime as needed for sleep.  09/05/15   [provider]  VITAMIN E PO Take 1 tablet by mouth every morning.    [provider]    Allergies: Patient has no known allergies.    Review of Systems  Updated Vital Signs BP (!) 135/119   Pulse 81   Temp 98.5 F (36.9 C) (Oral)   Resp 12   Ht 6' 2 (1.88 m)   Wt 83.9 kg   SpO2 96%   BMI 23.75 kg/m   Physical Exam  (all labs ordered are listed, but only abnormal results are displayed) Labs Reviewed  COMPREHENSIVE METABOLIC PANEL WITH GFR - Abnormal; Notable for the following components:      Result Value   GFR, Estimated 60 (*)    All other components within normal limits  BRAIN NATRIURETIC PEPTIDE  CBC WITH DIFFERENTIAL/PLATELET    EKG: None  Radiology: No results found.  {Document cardiac monitor, telemetry assessment procedure when appropriate:32947} Procedures   Medications Ordered in the ED - No data to display    {Click here for ABCD2, HEART and other calculators REFRESH Note before signing:1}  Medical Decision Making Amount and/or Complexity of Data Reviewed Labs: ordered.  Risk Prescription drug management.   ***  {Document critical care time when appropriate  Document review of labs and clinical decision tools ie CHADS2VASC2, etc  Document your independent review of radiology images and any outside records  Document your discussion with family members, caretakers and with consultants  Document social determinants of health affecting pt's care  Document your decision making why or why not admission, treatments were needed:32947:::1}   Final diagnoses:  Peripheral edema    ED Discharge Orders           Ordered    triamterene -hydrochlorothiazide (MAXZIDE-25) 37.5-25 MG tablet  Daily        05/18/24 2120

## 2024-05-18 NOTE — ED Triage Notes (Signed)
 Pt arrived via POV from home c/o bilateral lower extremity swelling and reports his hands have been swelling as well. Pt reports swelling has been getting worse over past 2-3 days.

## 2024-08-06 ENCOUNTER — Other Ambulatory Visit: Payer: Self-pay

## 2024-08-06 ENCOUNTER — Emergency Department (HOSPITAL_COMMUNITY)

## 2024-08-06 ENCOUNTER — Encounter (HOSPITAL_COMMUNITY): Payer: Self-pay

## 2024-08-06 ENCOUNTER — Emergency Department (HOSPITAL_COMMUNITY)
Admission: EM | Admit: 2024-08-06 | Discharge: 2024-08-06 | Disposition: A | Discharge status: Home / Self Care | Attending: Emergency Medicine | Admitting: Emergency Medicine

## 2024-08-06 DIAGNOSIS — M25462 Effusion, left knee: Secondary | ICD-10-CM | POA: Insufficient documentation

## 2024-08-06 DIAGNOSIS — Z7982 Long term (current) use of aspirin: Secondary | ICD-10-CM | POA: Diagnosis not present

## 2024-08-06 DIAGNOSIS — X501XXA Overexertion from prolonged static or awkward postures, initial encounter: Secondary | ICD-10-CM | POA: Insufficient documentation

## 2024-08-06 DIAGNOSIS — M1612 Unilateral primary osteoarthritis, left hip: Secondary | ICD-10-CM | POA: Diagnosis not present

## 2024-08-06 DIAGNOSIS — M25552 Pain in left hip: Secondary | ICD-10-CM | POA: Diagnosis present

## 2024-08-06 MED ORDER — NAPROXEN 250 MG PO TABS
500.0000 mg | ORAL_TABLET | Freq: Once | ORAL | Status: AC
  Administered 2024-08-06: 500 mg via ORAL
  Filled 2024-08-06: qty 2

## 2024-08-06 MED ORDER — NAPROXEN 500 MG PO TABS: 500.0000 mg | ORAL_TABLET | Freq: Two times a day (BID) | ORAL | 0 refills | Status: AC

## 2024-08-06 NOTE — ED Triage Notes (Signed)
 Pt presents with L hip pain that got worse when he twisted his knee and ankle getting out of bed a week ago. He also reports bilateral hand and feet numbness x 1 month.

## 2024-08-06 NOTE — Discharge Instructions (Signed)
 Your x-rays today show that you have lots of inflammation in your joint and severe arthritis of your hip.  I would recommend that you follow-up with an orthopedist, if you want to stay here in [place] please see the phone number for Dr. Margrette, they can help with severe joint pain with joint replacements  Please take Naprosyn , 500mg  by mouth twice daily as needed for pain - this in an antiinflammatory medicine (NSAID) and is similar to ibuprofen  - many people feel that it is stronger than ibuprofen  and it is easier to take since it is a smaller pill.  Please use this only for 1 week - if your pain persists, you will need to follow up with your doctor in the office for ongoing guidance and pain control.

## 2024-08-06 NOTE — ED Provider Notes (Signed)
 [place] EMERGENCY DEPARTMENT AT [facility] Provider Note   CSN: [id number] Arrival date & time: 08/06/24  1540     Patient presents with: Hip Pain (/)   Dennis Ruiz is a 83 y.o. male.  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  He is very resistant to wanting to do this, because of that he states he has been suffering for quite a long time with pain in his left hip.  After twisting his left knee a week ago he has had some increasing pain and swelling in his leg and has pain that is consistent all throughout the night.  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  He has not had any fevers or chills     Prior to Admission medications   Medication Sig Start Date End Date Taking? Authorizing Provider  aspirin  EC 81 MG tablet Take 81 mg by mouth every morning.    [provider]  Cholecalciferol (VITAMIN D PO) Take 2 tablets by mouth daily. 10,000IUs daily    [provider]  diphenhydrAMINE  (BENADRYL ) 25 MG tablet Take 1 tablet (25 mg total) by mouth every 6 (six) hours. Patient not taking: Reported on 08/24/2023 04/09/21   Idol, Julie, PA-C  fluticasone  (FLONASE ) 50 MCG/ACT nasal spray Place 2 sprays into both nostrils daily. Patient not taking: Reported on 08/24/2023 08/08/23   [name] A, PA-C  gabapentin  (NEURONTIN ) 300 MG capsule Take 1 capsule (300 mg total) by mouth 2 (two) times daily for 1 day, THEN 1 capsule (300 mg total) 3 (three) times daily. 04/11/24 05/12/24  [name], MD  HYDROcodone -acetaminophen  (NORCO) 7.5-325 MG tablet Take 0.5 tablets by mouth every 6 (six) hours as needed for  moderate pain.     [provider]  linaclotide LARUE) 145 MCG CAPS capsule Take 1 capsule every day by oral route as directed. 06/26/23   [provider]  mirabegron  ER (MYRBETRIQ ) 25 MG TB24 tablet Take 1 tablet (25 mg total) by mouth daily. start with the 25 mg and increase to 50 mg if needed Patient not taking: Reported on 02/05/2023 11/21/21   Watt Rush, MD  pantoprazole  (PROTONIX ) 40 MG tablet Take 1 tablet (40 mg total) by mouth daily. 08/27/23   [name], NP  polyethylene glycol (MIRALAX  / GLYCOLAX ) 17 g packet Take 17 g by mouth daily. 06/18/23   Robinson, John K, PA-C  temazepam (RESTORIL) 30 MG capsule Take 30 mg by mouth at bedtime as needed for sleep.  09/05/15   [provider]  triamterene -hydrochlorothiazide (MAXZIDE-25) 37.5-25 MG tablet Take 1 tablet by mouth daily. 05/18/24   Zammit, Joseph, MD  VITAMIN E PO Take 1 tablet by mouth every morning.    [provider]    Allergies: Patient has no known allergies.    Review of Systems  All other systems reviewed and are negative.   Updated Vital Signs BP (!) 128/93 (BP Location: Left Arm)   Pulse (!) 104   Temp 98.8 F (37.1 C) (Oral)   Resp 20   Ht 1.88 m (6' 2)  Wt 81.6 kg   SpO2 97%   BMI 23.11 kg/m   Physical Exam Vitals and nursing note reviewed.  Constitutional:      General: He is not in acute distress.    Appearance: He is well-developed.  HENT:     Head: Normocephalic and atraumatic.     Mouth/Throat:     Pharynx: No oropharyngeal exudate.  Eyes:     General: No scleral icterus.       Right eye: No discharge.        Left eye: No discharge.     Conjunctiva/sclera: Conjunctivae normal.     Pupils: Pupils are equal, round, and reactive to light.  Neck:     Thyroid: No thyromegaly.     Vascular: No JVD.  Cardiovascular:     Rate and Rhythm: Normal rate and regular rhythm.     Heart sounds: Normal heart sounds. No murmur heard.    No friction rub. No  gallop.  Pulmonary:     Effort: Pulmonary effort is normal. No respiratory distress.     Breath sounds: Normal breath sounds. No wheezing or rales.  Abdominal:     General: Bowel sounds are normal. There is no distension.     Palpations: Abdomen is soft. There is no mass.     Tenderness: There is no abdominal tenderness.  Musculoskeletal:        General: No tenderness. Normal range of motion.     Cervical back: Normal range of motion and neck supple.  Lymphadenopathy:     Cervical: No cervical adenopathy.  Skin:    General: Skin is warm and dry.     Findings: No erythema or rash.  Neurological:     Mental Status: He is alert.     Coordination: Coordination normal.     Comments: Able to straight leg raise bilaterally, he can bend both of his knees although his left knee has a clinical effusion and there is mild edema of the left lower extremity  Psychiatric:        Behavior: Behavior normal.     (all labs ordered are listed, but only abnormal results are displayed) Labs Reviewed - No data to display  EKG: None  Radiology: No results found.  {Document cardiac monitor, telemetry assessment procedure when appropriate:32947} Procedures   Medications Ordered in the ED  naproxen  (NAPROSYN ) tablet 500 mg (has no administration in time range)      {Click here for ABCD2, HEART and other calculators REFRESH Note before signing:1}                              Medical Decision Making Amount and/or Complexity of Data Reviewed Radiology: ordered.  Risk Prescription drug management.   The patient reports to me that his neuropathy is at baseline, he has chronic numbness of all 4 extremities, he has normal strength in his bilateral legs but has pain with range of motion of his left hip.  With passive internal and external rotation there is no significant pain, there is no crepitance, there is no leg length discrepancy, he does have a joint effusion, will image the knee and the hip.   Anticipate anti-inflammatories, recommended orthopedic follow-up, the patient is agreeable.  Upon leaving the room the patient states my niece wants you to check me for cancer with blood work, I had a discussion with the patient about how this is an outpatient evaluation and not an  ER evaluation, he has no other symptoms of cancer, he just does not go to the doctor very often   Radiology Imaging: I personally viewed the images of the ordered radiographic studies and find *** I agree with the radiologist interpretation as well   Meds / Interventions: while in the ED the patient received the following:  *** The response to the interventions was that the patient ***   {Document critical care time when appropriate  Document review of labs and clinical decision tools ie CHADS2VASC2, etc  Document your independent review of radiology images and any outside records  Document your discussion with family members, caretakers and with consultants  Document social determinants of health affecting pt's care  Document your decision making why or why not admission, treatments were needed:32947:::1}   Final diagnoses:  None    ED Discharge Orders     None

## 2024-08-06 NOTE — ED Notes (Signed)
 Pt/family received d/c paperwork at this time. After going over the paperwork any questions, comments, or concerns were answered to the best of this nurse's knowledge. The pt/family verbally acknowledged the teachings/instructions.

## 2024-09-18 ENCOUNTER — Encounter (HOSPITAL_COMMUNITY): Payer: Self-pay

## 2024-09-18 ENCOUNTER — Emergency Department (HOSPITAL_COMMUNITY)

## 2024-09-18 ENCOUNTER — Emergency Department (HOSPITAL_COMMUNITY)
Admission: EM | Admit: 2024-09-18 | Discharge: 2024-09-18 | Disposition: A | Discharge status: Home / Self Care | Attending: Emergency Medicine | Admitting: Emergency Medicine

## 2024-09-18 ENCOUNTER — Other Ambulatory Visit: Payer: Self-pay

## 2024-09-18 DIAGNOSIS — R634 Abnormal weight loss: Secondary | ICD-10-CM | POA: Insufficient documentation

## 2024-09-18 DIAGNOSIS — R2681 Unsteadiness on feet: Secondary | ICD-10-CM | POA: Diagnosis not present

## 2024-09-18 DIAGNOSIS — I10 Essential (primary) hypertension: Secondary | ICD-10-CM | POA: Insufficient documentation

## 2024-09-18 DIAGNOSIS — G4700 Insomnia, unspecified: Secondary | ICD-10-CM | POA: Insufficient documentation

## 2024-09-18 DIAGNOSIS — R200 Anesthesia of skin: Secondary | ICD-10-CM | POA: Diagnosis present

## 2024-09-18 DIAGNOSIS — Z7982 Long term (current) use of aspirin: Secondary | ICD-10-CM | POA: Insufficient documentation

## 2024-09-18 LAB — COMPREHENSIVE METABOLIC PANEL WITH GFR
ALT: 18 U/L (ref 0–44)
AST: 30 U/L (ref 15–41)
Albumin: 5.1 g/dL — ABNORMAL HIGH (ref 3.5–5.0)
Alkaline Phosphatase: 97 U/L (ref 38–126)
Anion gap: 12 (ref 5–15)
BUN: 23 mg/dL (ref 8–23)
CO2: 28 mmol/L (ref 22–32)
Calcium: 9.8 mg/dL (ref 8.9–10.3)
Chloride: 94 mmol/L — ABNORMAL LOW (ref 98–111)
Creatinine, Ser: 1.38 mg/dL — ABNORMAL HIGH (ref 0.61–1.24)
GFR, Estimated: 50 mL/min — ABNORMAL LOW (ref >60)
Glucose, Bld: 104 mg/dL — ABNORMAL HIGH (ref 70–99)
Potassium: 4.3 mmol/L (ref 3.5–5.1)
Sodium: 134 mmol/L — ABNORMAL LOW (ref 135–145)
Total Bilirubin: 0.6 mg/dL (ref 0.0–1.2)
Total Protein: 8.7 g/dL — ABNORMAL HIGH (ref 6.5–8.1)

## 2024-09-18 LAB — CBC WITH DIFFERENTIAL/PLATELET
Abs Immature Granulocytes: 0.01 K/uL (ref 0.00–0.07)
Basophils Absolute: 0 K/uL (ref 0.0–0.1)
Basophils Relative: 1 %
Eosinophils Absolute: 0.1 K/uL (ref 0.0–0.5)
Eosinophils Relative: 3 %
HCT: 47 % (ref 39.0–52.0)
Hemoglobin: 16 g/dL (ref 13.0–17.0)
Immature Granulocytes: 0 %
Lymphocytes Relative: 47 %
Lymphs Abs: 2.2 K/uL (ref 0.7–4.0)
MCH: 31.5 pg (ref 26.0–34.0)
MCHC: 34 g/dL (ref 30.0–36.0)
MCV: 92.5 fL (ref 80.0–100.0)
Monocytes Absolute: 0.5 K/uL (ref 0.1–1.0)
Monocytes Relative: 11 %
Neutro Abs: 1.8 K/uL (ref 1.7–7.7)
Neutrophils Relative %: 38 %
Platelets: 183 K/uL (ref 150–400)
RBC: 5.08 MIL/uL (ref 4.22–5.81)
RDW: 12.8 % (ref 11.5–15.5)
WBC: 4.7 K/uL (ref 4.0–10.5)
nRBC: 0 % (ref 0.0–0.2)

## 2024-09-18 LAB — URINALYSIS, ROUTINE W REFLEX MICROSCOPIC
Bilirubin Urine: NEGATIVE
Glucose, UA: NEGATIVE mg/dL
Hgb urine dipstick: NEGATIVE
Ketones, ur: NEGATIVE mg/dL
Leukocytes,Ua: NEGATIVE
Nitrite: NEGATIVE
Protein, ur: NEGATIVE mg/dL
Specific Gravity, Urine: 1.01 (ref 1.005–1.030)
pH: 5 (ref 5.0–8.0)

## 2024-09-18 LAB — CBG MONITORING, ED
Glucose-Capillary: 68 mg/dL — ABNORMAL LOW (ref 70–99)
Glucose-Capillary: 93 mg/dL (ref 70–99)

## 2024-09-18 LAB — TSH: TSH: 0.927 u[IU]/mL (ref 0.350–4.500)

## 2024-09-18 MED ORDER — IOHEXOL 300 MG/ML  SOLN
75.0000 mL | Freq: Once | INTRAMUSCULAR | Status: AC | PRN
  Administered 2024-09-18: 75 mL via INTRAVENOUS

## 2024-09-18 MED ORDER — TEMAZEPAM 7.5 MG PO CAPS: 7.5000 mg | ORAL_CAPSULE | Freq: Every evening | ORAL | 0 refills | Status: AC | PRN

## 2024-09-18 NOTE — ED Triage Notes (Signed)
 Patient presents POV from home c/c numbness of hands up to elbows and feet up to knees bilaterally x at least a few weeks. Also reports some weight loss. Visited PCP a month ago who was unable to provide answers.

## 2024-09-18 NOTE — ED Notes (Signed)
 Ambulated patient around nurses station HR 15 O2 99

## 2024-09-18 NOTE — ED Notes (Signed)
 Gave pt orange juice and snack d/t CBG 68.

## 2024-09-18 NOTE — ED Provider Notes (Signed)
 [place] EMERGENCY DEPARTMENT AT [facility] Provider Note   CSN: [id number] Arrival date & time: 09/18/24  1313     Patient presents with: Numbness   Dennis Ruiz is a 83 y.o. male with a history including chronic neck and lumbar pain, hypertension, BPH presenting with multiple complaints, he reports having numbness in his bilateral hands and feet and cold sensation in his extremities which have been present for more than the past month.  He also states having difficulty with ambulation secondary to the symptoms, feeling unsteady on his feet.  He also endorses about a 10 pound weight loss over the past month, he reports poor appetite, denies chest pain, abdominal pain, nausea or vomiting.  Denies dysuria but does endorse having increased thirst, especially at night, also endorses frequent urination but this is chronic secondary to his BPH, and unchanged. Also reports difficulty sleeping at night.  Used to be on restoril which helped him sleep.   He reports a full sensation in his neck below his chin and finds himself frequently trying to swallow since this began, denies painful swallowing, no sob, no fevers.  This symptom has been present for the past month.  He denies mouth swelling or pain. He is a former smoker.    The history is provided by the patient.       Prior to Admission medications   Medication Sig Start Date End Date Taking? Authorizing Provider  temazepam (RESTORIL) 7.5 MG capsule Take 1 capsule (7.5 mg total) by mouth at bedtime as needed for sleep. 09/18/24  Yes Maude Hettich, PA-C  aspirin  EC 81 MG tablet Take 81 mg by mouth every morning.    [provider]  Cholecalciferol (VITAMIN D PO) Take 2 tablets by mouth daily. 10,000IUs daily    [provider]  diphenhydrAMINE  (BENADRYL ) 25 MG tablet Take 1 tablet (25 mg total) by mouth every 6 (six) hours. Patient not taking: Reported on 08/24/2023 04/09/21   Vaneza Pickart, PA-C  fluticasone   (FLONASE ) 50 MCG/ACT nasal spray Place 2 sprays into both nostrils daily. Patient not taking: Reported on 08/24/2023 08/08/23   [name] A, PA-C  gabapentin  (NEURONTIN ) 300 MG capsule Take 1 capsule (300 mg total) by mouth 2 (two) times daily for 1 day, THEN 1 capsule (300 mg total) 3 (three) times daily. 04/11/24 05/12/24  [name], MD  HYDROcodone -acetaminophen  (NORCO) 7.5-325 MG tablet Take 0.5 tablets by mouth every 6 (six) hours as needed for moderate pain.     [provider]  linaclotide LARUE) 145 MCG CAPS capsule Take 1 capsule every day by oral route as directed. 06/26/23   [provider]  mirabegron  ER (MYRBETRIQ ) 25 MG TB24 tablet Take 1 tablet (25 mg total) by mouth daily. start with the 25 mg and increase to 50 mg if needed Patient not taking: Reported on 02/05/2023 11/21/21   Watt Rush, MD  naproxen  (NAPROSYN ) 500 MG tablet Take 1 tablet (500 mg total) by mouth 2 (two) times daily with a meal. 08/06/24   Cleotilde Rogue, MD  pantoprazole  (PROTONIX ) 40 MG tablet Take 1 tablet (40 mg total) by mouth daily. 08/27/23   [name], NP  polyethylene glycol (MIRALAX  / GLYCOLAX ) 17 g packet Take 17 g by mouth daily. 06/18/23   Robinson, John K, PA-C  traZODone (DESYREL) 50 MG tablet Take 50 mg by mouth at bedtime as needed for sleep. 08/26/24   [provider]  triamterene -hydrochlorothiazide (MAXZIDE-25) 37.5-25 MG tablet Take  1 tablet by mouth daily. 05/18/24   Zammit, Joseph, MD  VITAMIN E PO Take 1 tablet by mouth every morning.    [provider]    Allergies: Patient has no known allergies.    Review of Systems  Constitutional:  Positive for appetite change and unexpected weight change. Negative for fever.  HENT:  Negative for congestion, sore throat and trouble swallowing.   Eyes: Negative.   Respiratory:  Negative for chest tightness and shortness of breath.   Cardiovascular:  Negative for chest pain.  Gastrointestinal:   Negative for abdominal pain and nausea.  Genitourinary: Negative.   Musculoskeletal:  Negative for arthralgias, joint swelling and neck pain.  Skin: Negative.  Negative for rash and wound.  Neurological:  Positive for numbness. Negative for dizziness, weakness, light-headedness and headaches.  Psychiatric/Behavioral: Negative.      Updated Vital Signs BP 126/82 (BP Location: Right Arm)   Pulse (!) 108   Temp 97.8 F (36.6 C) (Oral)   Resp 17   Ht 6' 2 (1.88 m)   Wt 83.9 kg   SpO2 98%   BMI 23.75 kg/m   Physical Exam Vitals and nursing note reviewed.  Constitutional:      Appearance: He is well-developed.  HENT:     Head: Normocephalic and atraumatic.  Eyes:     Conjunctiva/sclera: Conjunctivae normal.  Cardiovascular:     Rate and Rhythm: Normal rate and regular rhythm.     Pulses:          Radial pulses are 2+ on the right side and 2+ on the left side.       Dorsalis pedis pulses are 1+ on the right side and 1+ on the left side.     Heart sounds: Normal heart sounds.  Pulmonary:     Effort: Pulmonary effort is normal.     Breath sounds: Normal breath sounds. No wheezing.  Abdominal:     General: Bowel sounds are normal.     Palpations: Abdomen is soft.     Tenderness: There is no abdominal tenderness.  Musculoskeletal:        General: Normal range of motion.     Cervical back: Normal range of motion.  Skin:    General: Skin is warm and dry.     Capillary Refill: Capillary refill takes 2 to 3 seconds.  Neurological:     General: No focal deficit present.     Mental Status: He is alert and oriented to person, place, and time.     (all labs ordered are listed, but only abnormal results are displayed) Labs Reviewed  COMPREHENSIVE METABOLIC PANEL WITH GFR - Abnormal; Notable for the following components:      Result Value   Sodium 134 (*)    Chloride 94 (*)    Glucose, Bld 104 (*)    Creatinine, Ser 1.38 (*)    Total Protein 8.7 (*)    Albumin 5.1 (*)     GFR, Estimated 50 (*)    All other components within normal limits  CBG MONITORING, ED - Abnormal; Notable for the following components:   Glucose-Capillary 68 (*)    All other components within normal limits  CBC WITH DIFFERENTIAL/PLATELET  URINALYSIS, ROUTINE W REFLEX MICROSCOPIC  TSH  CBG MONITORING, ED    EKG: None  Radiology: CT Soft Tissue Neck W Contrast Result Date: 09/18/2024 CLINICAL DATA:  Neck fullness.  Weight loss.  Smoking history. EXAM: CT NECK WITH CONTRAST TECHNIQUE: Multidetector CT imaging  of the neck was performed using the standard protocol following the bolus administration of intravenous contrast. RADIATION DOSE REDUCTION: This exam was performed according to the departmental dose-optimization program which includes automated exposure control, adjustment of the mA and/or kV according to patient size and/or use of iterative reconstruction technique. CONTRAST:  75mL OMNIPAQUE  IOHEXOL  300 MG/ML  SOLN COMPARISON:  Cervical CT 02/05/2023 FINDINGS: Pharynx and larynx: No mucosal or submucosal mass or apparent inflammatory change. Salivary glands: Parotid and submandibular glands are normal. Thyroid: Normal Lymph nodes: No lymphadenopathy on either side of the neck. No other soft tissue mass lesion visible. Vascular: Tortuous carotid arteries. Atherosclerotic calcification at the carotid bifurcations but no flow limiting stenosis suspected. Limited intracranial: Normal Visualized orbits: Normal Mastoids and visualized paranasal sinuses: No inflammatory disease. Skeleton: Chronic cervical degenerative changes. Upper chest: Lung apices show mild emphysema without focal or active process otherwise. Other: None IMPRESSION: 1. No abnormality seen to explain the clinical presentation. No evidence of mass or lymphadenopathy. 2. Tortuous carotid arteries with atherosclerotic calcification at the carotid bifurcations but no flow limiting stenosis suspected. 3. Chronic cervical degenerative  changes. 4. Emphysema. Emphysema (ICD10-J43.9). Electronically Signed   By: Oneil Officer M.D.   On: 09/18/2024 16:01     Procedures   Medications Ordered in the ED  iohexol  (OMNIPAQUE ) 300 MG/ML solution 75 mL (75 mLs Intravenous Contrast Given 09/18/24 1531)                                    Medical Decision Making Patient presenting with multiple complaints, describing numbness in his hands and feet along with cold sensation although he has got adequate circulation, this does not appear to be a vascular issue.  He has no weakness on his exam in his extremities.  He also has complaint of a 10 pound weight loss, loss of appetite, insomnia and a full sensation in his neck, although I do not appreciate any obvious mass or lymphadenopathy on his exam.  Labs and imaging have been obtained and are reassuring.  He has complaint of increasing generalized weakness, uses a walker at baseline.  He has had PT in the past but feels it made his low back and chronic left hip pain worse.  Used to be on Vicodin, also took Restoril at night for insomnia.  Was taken off both of these medicines about 6 months ago.  He has follow-up care with his primary MD in about 2 weeks.  Given his weakness I do not feel comfortable prescribing him Vicodin, especially given his age, however I will put him back on a low-dose Restoril to help him sleep until he can follow-up with his primary provider.  He was on 30 mg, but this was more than 6 months ago.  Restoril 7.5 mg was prescribed #14.  Patient was encouraged to keep his appointment with Dr. Shona.  Amount and/or Complexity of Data Reviewed Labs: ordered.    Details: Labs reviewed, CBC normal, urinalysis negative for UTI, his CMET has no significant electrolyte outliers.  His creatinine is 1.38 and his albumin is 5.1.  Creatinine appears to be near his baseline.  TSH normal range. Radiology: ordered.    Details: CT soft tissue neck negative for mass.  Risk Prescription  drug management.        Final diagnoses:  Numbness  Insomnia, unspecified type    ED Discharge Orders  Ordered    temazepam (RESTORIL) 7.5 MG capsule  At bedtime PRN        09/18/24 1655               Alyia Lacerte, PA-C 09/18/24 1703    Suzette Pac, MD 09/20/24 1106

## 2024-09-18 NOTE — Discharge Instructions (Signed)
 Your lab tests, exam and CT scan are all reassuring today with no obvious reason for your symptoms, however I suspect you may need further test which she should discuss with Dr. Shona if your symptoms persist.  I am prescribing you a short course of Restoril to help you sleep at night.  This is a smaller dose than you used to take, but since it has been so long since your last dose we need to start at a lower level and can slowly be increased over time if needed.

## 2024-10-23 ENCOUNTER — Emergency Department (HOSPITAL_COMMUNITY)

## 2024-10-23 ENCOUNTER — Encounter (HOSPITAL_COMMUNITY): Payer: Self-pay | Admitting: *Deleted

## 2024-10-23 ENCOUNTER — Emergency Department (HOSPITAL_COMMUNITY)
Admission: EM | Admit: 2024-10-23 | Discharge: 2024-10-23 | Disposition: A | Discharge status: Home / Self Care | Attending: Emergency Medicine | Admitting: Emergency Medicine

## 2024-10-23 ENCOUNTER — Other Ambulatory Visit: Payer: Self-pay

## 2024-10-23 DIAGNOSIS — I10 Essential (primary) hypertension: Secondary | ICD-10-CM | POA: Diagnosis not present

## 2024-10-23 DIAGNOSIS — R202 Paresthesia of skin: Secondary | ICD-10-CM | POA: Insufficient documentation

## 2024-10-23 DIAGNOSIS — K5900 Constipation, unspecified: Secondary | ICD-10-CM | POA: Insufficient documentation

## 2024-10-23 DIAGNOSIS — Z7982 Long term (current) use of aspirin: Secondary | ICD-10-CM | POA: Insufficient documentation

## 2024-10-23 DIAGNOSIS — Z79899 Other long term (current) drug therapy: Secondary | ICD-10-CM | POA: Diagnosis not present

## 2024-10-23 HISTORY — DX: Polyneuropathy, unspecified: G62.9

## 2024-10-23 LAB — COMPREHENSIVE METABOLIC PANEL WITH GFR
ALT: 19 U/L (ref 0–44)
AST: 26 U/L (ref 15–41)
Albumin: 4.5 g/dL (ref 3.5–5.0)
Alkaline Phosphatase: 63 U/L (ref 38–126)
Anion gap: 13 (ref 5–15)
BUN: 17 mg/dL (ref 8–23)
CO2: 26 mmol/L (ref 22–32)
Calcium: 9.8 mg/dL (ref 8.9–10.3)
Chloride: 101 mmol/L (ref 98–111)
Creatinine, Ser: 1.21 mg/dL (ref 0.61–1.24)
GFR, Estimated: 59 mL/min — ABNORMAL LOW
Glucose, Bld: 88 mg/dL (ref 70–99)
Potassium: 4.4 mmol/L (ref 3.5–5.1)
Sodium: 140 mmol/L (ref 135–145)
Total Bilirubin: 0.4 mg/dL (ref 0.0–1.2)
Total Protein: 7.4 g/dL (ref 6.5–8.1)

## 2024-10-23 LAB — CBC WITH DIFFERENTIAL/PLATELET
Abs Immature Granulocytes: 0.01 K/uL (ref 0.00–0.07)
Basophils Absolute: 0 K/uL (ref 0.0–0.1)
Basophils Relative: 1 %
Eosinophils Absolute: 0.1 K/uL (ref 0.0–0.5)
Eosinophils Relative: 2 %
HCT: 41.7 % (ref 39.0–52.0)
Hemoglobin: 14.2 g/dL (ref 13.0–17.0)
Immature Granulocytes: 0 %
Lymphocytes Relative: 40 %
Lymphs Abs: 1.6 K/uL (ref 0.7–4.0)
MCH: 32.1 pg (ref 26.0–34.0)
MCHC: 34.1 g/dL (ref 30.0–36.0)
MCV: 94.3 fL (ref 80.0–100.0)
Monocytes Absolute: 0.4 K/uL (ref 0.1–1.0)
Monocytes Relative: 10 %
Neutro Abs: 1.9 K/uL (ref 1.7–7.7)
Neutrophils Relative %: 47 %
Platelets: 162 K/uL (ref 150–400)
RBC: 4.42 MIL/uL (ref 4.22–5.81)
RDW: 12.8 % (ref 11.5–15.5)
WBC: 4 K/uL (ref 4.0–10.5)
nRBC: 0 % (ref 0.0–0.2)

## 2024-10-23 LAB — MAGNESIUM: Magnesium: 2.3 mg/dL (ref 1.7–2.4)

## 2024-10-23 LAB — URINALYSIS, ROUTINE W REFLEX MICROSCOPIC
Bilirubin Urine: NEGATIVE
Glucose, UA: NEGATIVE mg/dL
Hgb urine dipstick: NEGATIVE
Ketones, ur: NEGATIVE mg/dL
Leukocytes,Ua: NEGATIVE
Nitrite: NEGATIVE
Protein, ur: NEGATIVE mg/dL
Specific Gravity, Urine: 1.006 (ref 1.005–1.030)
pH: 7 (ref 5.0–8.0)

## 2024-10-23 LAB — LIPASE, BLOOD: Lipase: 35 U/L (ref 11–51)

## 2024-10-23 LAB — TSH: TSH: 0.507 u[IU]/mL (ref 0.350–4.500)

## 2024-10-23 MED ORDER — IOHEXOL 300 MG/ML  SOLN
100.0000 mL | Freq: Once | INTRAMUSCULAR | Status: AC | PRN
  Administered 2024-10-23: 100 mL via INTRAVENOUS

## 2024-10-23 NOTE — Discharge Instructions (Signed)
 Take a daily stool softener such as Colace to help with constipation.  Drink plenty of fluids.  Follow-up with urology tomorrow as scheduled.  Please follow-up with PCP in 1 week as well for ED follow-up.  Return to ED if any symptoms worsen including new falls, severe headache, syncopal episode, chest pain, shortness of breath.

## 2024-10-23 NOTE — ED Triage Notes (Signed)
 Pt with numbness to bilateral hands and bilateral feet, ongoing  for awhile, but has got worse.

## 2024-10-23 NOTE — ED Provider Notes (Signed)
 " Dennis Ruiz Provider Note   CSN: [id number] Arrival date & time: 10/23/24  1305     Patient presents with: Numbness   Dennis Ruiz is a 84 y.o. male.  Patient is a 84 year old male with a history of back pain, hypertension, BPH, anxiety, peripheral neuropathy who presents to the ED for increasing numbness and tingling to the bilateral hands and feet for the past month.  Patient notes this has been ongoing for several months but feels like it is worse since his ED visit back in November.  He states he was on gabapentin  but only took half of the pills as he believed it was not helping.  He was told he has peripheral neuropathy but does not believe this is the case.  Notes his arms and legs feel cold to him and have tingling sensations.  Denies fall or injury.  States he is scheduled to see a neurologist tomorrow.  Denies headache, dizziness, vision changes, neck pain, chest pain, shortness of breath, weakness.  Patient also notes he is having issues with constipation.  Notes he tried to take MiraLAX  with no relief.  States last bowel movement 4 days ago.  He states his abdomen feels bloated but is not having any pain.  He is passing gas.  Denies any pain medication use.  No further complaints.   HPI     Prior to Admission medications  Medication Sig Start Date End Date Taking? Authorizing Provider  aspirin  EC 81 MG tablet Take 81 mg by mouth every morning.    [provider]  Cholecalciferol (VITAMIN D PO) Take 2 tablets by mouth daily. 10,000IUs daily    [provider]  diphenhydrAMINE  (BENADRYL ) 25 MG tablet Take 1 tablet (25 mg total) by mouth every 6 (six) hours. Patient not taking: Reported on 08/24/2023 04/09/21   Idol, Julie, PA-C  fluticasone  (FLONASE ) 50 MCG/ACT nasal spray Place 2 sprays into both nostrils daily. Patient not taking: Reported on 08/24/2023 08/08/23   [name] A, PA-C  gabapentin  (NEURONTIN ) 300  MG capsule Take 1 capsule (300 mg total) by mouth 2 (two) times daily for 1 day, THEN 1 capsule (300 mg total) 3 (three) times daily. 04/11/24 05/12/24  [name], MD  HYDROcodone -acetaminophen  (NORCO) 7.5-325 MG tablet Take 0.5 tablets by mouth every 6 (six) hours as needed for moderate pain.     [provider]  linaclotide LARUE) 145 MCG CAPS capsule Take 1 capsule every day by oral route as directed. 06/26/23   [provider]  mirabegron  ER (MYRBETRIQ ) 25 MG TB24 tablet Take 1 tablet (25 mg total) by mouth daily. start with the 25 mg and increase to 50 mg if needed Patient not taking: Reported on 02/05/2023 11/21/21   Watt Rush, MD  naproxen  (NAPROSYN ) 500 MG tablet Take 1 tablet (500 mg total) by mouth 2 (two) times daily with a meal. 08/06/24   Cleotilde Rogue, MD  pantoprazole  (PROTONIX ) 40 MG tablet Take 1 tablet (40 mg total) by mouth daily. 08/27/23   [name], NP  polyethylene glycol (MIRALAX  / GLYCOLAX ) 17 g packet Take 17 g by mouth daily. 06/18/23   Robinson, John K, PA-C  temazepam  (RESTORIL ) 7.5 MG capsule Take 1 capsule (7.5 mg total) by mouth at bedtime as needed for sleep. 09/18/24   Idol, Julie, PA-C  traZODone (DESYREL) 50 MG tablet Take 50 mg by mouth at bedtime as needed for sleep. 08/26/24   [provider]  triamterene -hydrochlorothiazide (MAXZIDE-25) 37.5-25 MG tablet Take 1 tablet by mouth daily. 05/18/24   Zammit, Joseph, MD  VITAMIN E PO Take 1 tablet by mouth every morning.    [provider]    Allergies: Patient has no known allergies.    Review of Systems  Constitutional:  Negative for chills and fever.  Respiratory:  Negative for shortness of breath.   Cardiovascular:  Negative for chest pain.  Gastrointestinal:  Positive for constipation. Negative for abdominal pain, diarrhea, nausea and vomiting.  Neurological:  Positive for numbness. Negative for dizziness, syncope, weakness and headaches.  All other systems  reviewed and are negative.   Updated Vital Signs BP 138/80   Pulse 70   Temp 98.4 F (36.9 C) (Oral)   Resp 16   Ht 6' 2 (1.88 m)   Wt 79.4 kg   SpO2 98%   BMI 22.47 kg/m   Physical Exam Constitutional:      Appearance: Normal appearance.  HENT:     Head: Normocephalic and atraumatic.     Nose: Nose normal.     Mouth/Throat:     Mouth: Mucous membranes are moist.     Pharynx: Oropharynx is clear.  Cardiovascular:     Rate and Rhythm: Normal rate.  Pulmonary:     Effort: Pulmonary effort is normal.  Abdominal:     General: Bowel sounds are normal.     Palpations: Abdomen is soft.     Tenderness: There is no abdominal tenderness.  Musculoskeletal:        General: Normal range of motion.     Comments: Full range of motion of all 4 extremities with equal strength.  Radial and PT pulses 2+ bilaterally  Skin:    General: Skin is warm and dry.  Neurological:     Mental Status: He is alert and oriented to person, place, and time.     Cranial Nerves: No cranial nerve deficit.     Sensory: No sensory deficit.     Motor: No weakness.     Comments: No decreased sensation noted on bilateral hands, arms, and legs.  No cranial nerve deficits noted.  Psychiatric:        Mood and Affect: Mood normal.        Behavior: Behavior normal.     (all labs ordered are listed, but only abnormal results are displayed) Labs Reviewed  COMPREHENSIVE METABOLIC PANEL WITH GFR - Abnormal; Notable for the following components:      Result Value   GFR, Estimated 59 (*)    All other components within normal limits  URINALYSIS, ROUTINE W REFLEX MICROSCOPIC - Abnormal; Notable for the following components:   Color, Urine STRAW (*)    All other components within normal limits  CBC WITH DIFFERENTIAL/PLATELET  MAGNESIUM   TSH  LIPASE, BLOOD    EKG: EKG Interpretation Date/Time:  Sunday October 23 2024 13:44:18 EST Ventricular Rate:  81 PR Interval:  127 QRS Duration:  92 QT  Interval:  365 QTC Calculation: 424 R Axis:   60  Text Interpretation: Sinus rhythm Low voltage, precordial leads Confirmed by Cleotilde Rogue ([id number]) on 10/23/2024 1:55:38 PM  Radiology: CT ABDOMEN PELVIS W CONTRAST Result Date: 10/23/2024 CLINICAL DATA:  Bilateral lower extremity numbness.  Constipation. EXAM: CT ABDOMEN AND PELVIS WITH CONTRAST TECHNIQUE: Multidetector CT imaging of the abdomen and pelvis was performed using the standard protocol following bolus administration of intravenous contrast. RADIATION DOSE REDUCTION: This exam was performed according to the  departmental dose-optimization program which includes automated exposure control, adjustment of the mA and/or kV according to patient size and/or use of iterative reconstruction technique. CONTRAST:  OMNIPAQUE  IOHEXOL  300 MG/ML  SOLN COMPARISON:  July 17, 2023. FINDINGS: Lower chest: No acute abnormality. Hepatobiliary: No cholelithiasis or biliary dilatation. Stable small left and right hepatic low densities most consistent with cysts. Pancreas: Unremarkable. No pancreatic ductal dilatation or surrounding inflammatory changes. Spleen: Normal in size without focal abnormality. Adrenals/Urinary Tract: Adrenal glands appear normal. Stable bilateral renal cysts. No hydronephrosis or renal obstruction is noted. Stomach/Bowel: Stomach is within normal limits. Appendix appears normal. No evidence of bowel wall thickening, distention, or inflammatory changes. Vascular/Lymphatic: Aortic atherosclerosis. No enlarged abdominal or pelvic lymph nodes. Reproductive: Prostate is unremarkable. Other: No abdominal wall hernia or abnormality. No abdominopelvic ascites. Musculoskeletal: No acute or significant osseous findings. IMPRESSION: 1. No acute abnormality seen in the abdomen or pelvis. 2. Aortic atherosclerosis. Aortic Atherosclerosis (ICD10-I70.0). Electronically Signed   By: Lynwood Landy Raddle M.D.   On: 10/23/2024 16:44      Medications  Ordered in the ED  iohexol  (OMNIPAQUE ) 300 MG/ML solution 100 mL (100 mLs Intravenous Contrast Given 10/23/24 1619)                                   Medical Decision Making Patient is an 84 year old male with a history of peripheral neuropathy who presents to the ED for increasing numbness/tingling in his hands and feet since a previous ED visit in November.  Notes symptoms ongoing for several months.  Denies any fall or injury.  Also notes constipation for 4 days.  Please see detailed HPI above.  On exam patient well-appearing no acute distress.  Physical exam as noted above.  No acute neurologic deficits noted.  He has no decreased sensation on exam and has equal strength bilaterally.  Differential includes peripheral neuropathy, chronic paresthesias, electrolyte abnormality, insufficiency, DVT, TIA.  Less concerns for blood flow issues as he does have good pulses and no other acute signs.  Lab workup reassuring with no acute abnormalities.  EKG shows sinus rhythm.  CT abdomen pelvis obtained that was negative for acute process.  No large stool burden appreciated.  As well-appearing and this appears to be a chronic problem, no concerns for emergent workup or further imaging at this time.  Advised to begin taking a daily stool softener for constipation.  Advised to follow-up with neurology as scheduled tomorrow for follow-up.  Also advised PCP follow-up in 1 week for recheck.  He understands plan and is agreeable.  Return precautions provided.  Amount and/or Complexity of Data Reviewed Labs: ordered. Radiology: ordered.  Risk Prescription drug management.       Final diagnoses:  Paresthesias  Constipation, unspecified constipation type    ED Discharge Orders     None          Neysa Thersia RAMAN, [place] 10/23/24 1726  "

## 2024-10-25 ENCOUNTER — Encounter (HOSPITAL_COMMUNITY): Payer: Self-pay | Admitting: Surgery

## 2024-10-26 ENCOUNTER — Other Ambulatory Visit (HOSPITAL_COMMUNITY): Payer: Self-pay | Admitting: Surgery

## 2024-10-26 DIAGNOSIS — M4802 Spinal stenosis, cervical region: Secondary | ICD-10-CM

## 2024-10-26 DIAGNOSIS — M5416 Radiculopathy, lumbar region: Secondary | ICD-10-CM

## 2024-10-30 ENCOUNTER — Ambulatory Visit (HOSPITAL_COMMUNITY): Admission: RE | Admit: 2024-10-30 | Discharge: 2024-10-30 | Attending: Surgery | Admitting: Surgery

## 2024-10-30 DIAGNOSIS — M5416 Radiculopathy, lumbar region: Secondary | ICD-10-CM

## 2024-10-30 DIAGNOSIS — M4802 Spinal stenosis, cervical region: Secondary | ICD-10-CM | POA: Diagnosis present

## 2024-11-07 ENCOUNTER — Encounter: Payer: Self-pay | Admitting: Internal Medicine
